# Patient Record
Sex: Male | Born: 1935 | Race: White | Hispanic: No | Smoking: Former smoker
Health system: Southern US, Community
[De-identification: ages and names within clinical notes are randomized; demographics above are authoritative.]

## PROBLEM LIST (undated history)

## (undated) DIAGNOSIS — I499 Cardiac arrhythmia, unspecified: Secondary | ICD-10-CM

## (undated) DIAGNOSIS — N390 Urinary tract infection, site not specified: Secondary | ICD-10-CM

## (undated) DIAGNOSIS — M1990 Unspecified osteoarthritis, unspecified site: Secondary | ICD-10-CM

## (undated) DIAGNOSIS — J189 Pneumonia, unspecified organism: Secondary | ICD-10-CM

## (undated) DIAGNOSIS — C801 Malignant (primary) neoplasm, unspecified: Secondary | ICD-10-CM

## (undated) DIAGNOSIS — K219 Gastro-esophageal reflux disease without esophagitis: Secondary | ICD-10-CM

## (undated) DIAGNOSIS — E119 Type 2 diabetes mellitus without complications: Secondary | ICD-10-CM

## (undated) DIAGNOSIS — I10 Essential (primary) hypertension: Secondary | ICD-10-CM

## (undated) DIAGNOSIS — I2510 Atherosclerotic heart disease of native coronary artery without angina pectoris: Secondary | ICD-10-CM

## (undated) DIAGNOSIS — IMO0001 Reserved for inherently not codable concepts without codable children: Secondary | ICD-10-CM

## (undated) DIAGNOSIS — I219 Acute myocardial infarction, unspecified: Secondary | ICD-10-CM

## (undated) DIAGNOSIS — Z87442 Personal history of urinary calculi: Secondary | ICD-10-CM

## (undated) HISTORY — PX: OTHER SURGICAL HISTORY: SHX169

## (undated) HISTORY — DX: Essential (primary) hypertension: I10

## (undated) HISTORY — PX: HERNIA REPAIR: SHX51

## (undated) HISTORY — PX: EYE SURGERY: SHX253

---

## 1968-01-28 HISTORY — PX: OTHER SURGICAL HISTORY: SHX169

## 1978-01-27 HISTORY — PX: OTHER SURGICAL HISTORY: SHX169

## 1991-01-28 HISTORY — PX: CORONARY ANGIOPLASTY: SHX604

## 1992-01-28 HISTORY — PX: ABDOMINAL ANGIOGRAM: SHX5705

## 1994-01-27 HISTORY — PX: ABDOMINAL AORTIC ANEURYSM REPAIR: SUR1152

## 1997-01-27 HISTORY — PX: VASECTOMY: SHX75

## 1997-01-27 HISTORY — PX: OTHER SURGICAL HISTORY: SHX169

## 2000-01-28 HISTORY — PX: BACK SURGERY: SHX140

## 2000-07-29 ENCOUNTER — Encounter: Payer: Self-pay | Admitting: Orthopedic Surgery

## 2000-07-29 ENCOUNTER — Encounter (INDEPENDENT_AMBULATORY_CARE_PROVIDER_SITE_OTHER): Payer: Self-pay | Admitting: Specialist

## 2000-07-30 ENCOUNTER — Inpatient Hospital Stay (HOSPITAL_COMMUNITY): Admission: RE | Admit: 2000-07-30 | Discharge: 2000-07-31 | Payer: Self-pay | Admitting: Orthopedic Surgery

## 2002-01-27 DIAGNOSIS — I219 Acute myocardial infarction, unspecified: Secondary | ICD-10-CM

## 2002-01-27 HISTORY — DX: Acute myocardial infarction, unspecified: I21.9

## 2002-01-27 HISTORY — PX: CHOLECYSTECTOMY: SHX55

## 2002-01-27 HISTORY — PX: CORONARY ARTERY BYPASS GRAFT: SHX141

## 2002-09-15 ENCOUNTER — Inpatient Hospital Stay (HOSPITAL_COMMUNITY): Admission: EM | Admit: 2002-09-15 | Discharge: 2002-09-23 | Payer: Self-pay | Admitting: Cardiovascular Disease

## 2002-09-15 ENCOUNTER — Encounter: Payer: Self-pay | Admitting: Cardiovascular Disease

## 2002-09-19 ENCOUNTER — Encounter: Payer: Self-pay | Admitting: Cardiothoracic Surgery

## 2002-09-20 ENCOUNTER — Encounter: Payer: Self-pay | Admitting: Cardiothoracic Surgery

## 2002-09-21 ENCOUNTER — Encounter: Payer: Self-pay | Admitting: Cardiothoracic Surgery

## 2002-09-22 ENCOUNTER — Encounter: Payer: Self-pay | Admitting: Cardiothoracic Surgery

## 2004-03-26 ENCOUNTER — Ambulatory Visit: Payer: Self-pay | Admitting: Family Medicine

## 2004-07-29 ENCOUNTER — Ambulatory Visit: Payer: Self-pay | Admitting: Family Medicine

## 2004-11-29 ENCOUNTER — Ambulatory Visit: Payer: Self-pay | Admitting: Family Medicine

## 2005-01-12 ENCOUNTER — Emergency Department (HOSPITAL_COMMUNITY): Admission: EM | Admit: 2005-01-12 | Discharge: 2005-01-12 | Payer: Self-pay | Admitting: Emergency Medicine

## 2005-01-27 HISTORY — PX: OTHER SURGICAL HISTORY: SHX169

## 2006-12-11 ENCOUNTER — Ambulatory Visit (HOSPITAL_COMMUNITY): Admission: RE | Admit: 2006-12-11 | Discharge: 2006-12-13 | Payer: Self-pay | Admitting: Orthopedic Surgery

## 2006-12-11 ENCOUNTER — Encounter (INDEPENDENT_AMBULATORY_CARE_PROVIDER_SITE_OTHER): Payer: Self-pay | Admitting: Orthopedic Surgery

## 2009-01-27 HISTORY — PX: OTHER SURGICAL HISTORY: SHX169

## 2009-06-03 IMAGING — CR DG CHEST 2V
2 series · 2 of 2 positions shown · non-contrast
Comparison: none

CLINICAL DATA: Current HNP.  Previous smoker.  Hypertension.  
 5GAC4-0 VIEWS:
 Sternal wire sutures and mediastinal clips (CABG).  Peribronchial thickening compatible with chronic/remote bronchitic changes.  No active infiltrate.  Minimal nodular density, approximately 4mm in diameter at the right lung base compatible with a granuloma.  Normal size and contours with cardiomediastinal silhouette.  Intact bony thorax.

[w chest pa]
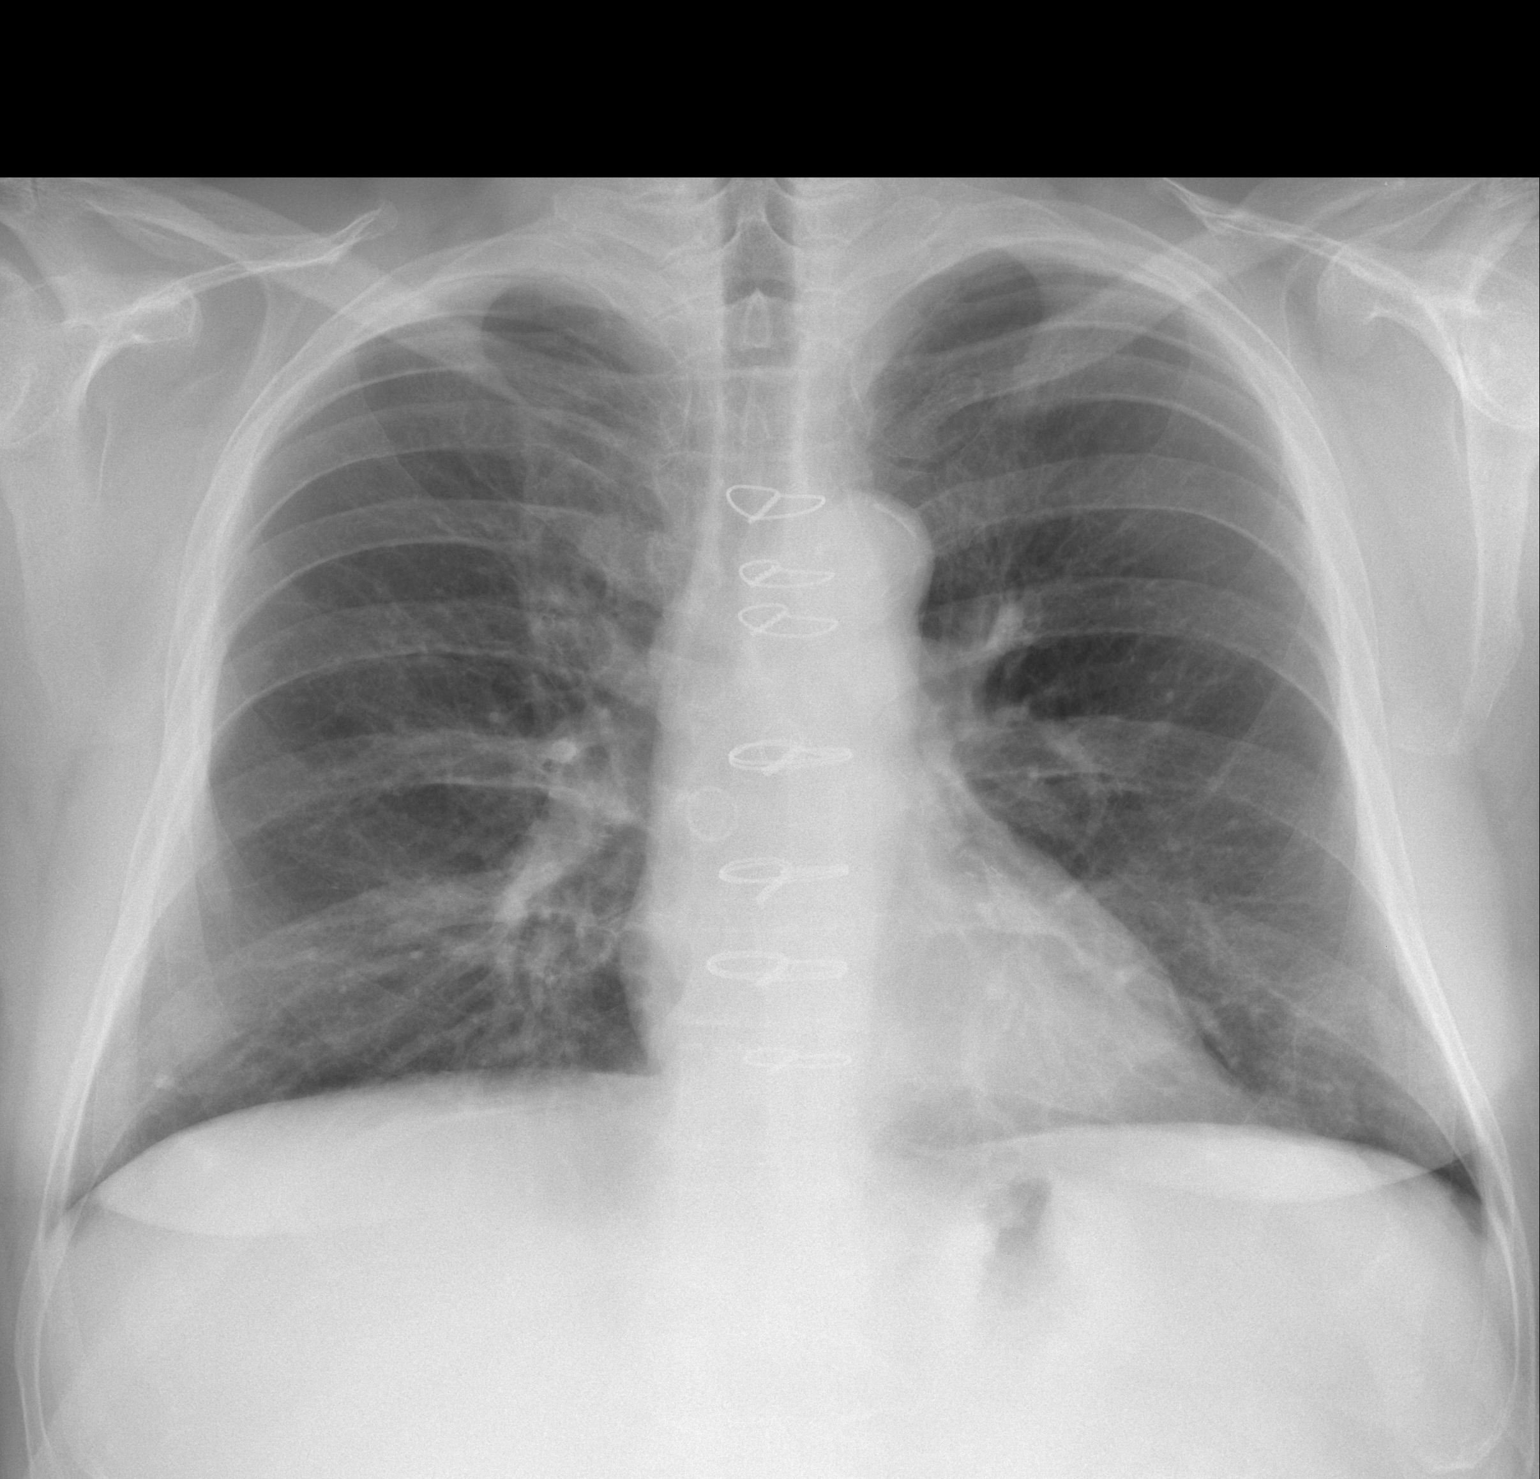

[w chest lat]
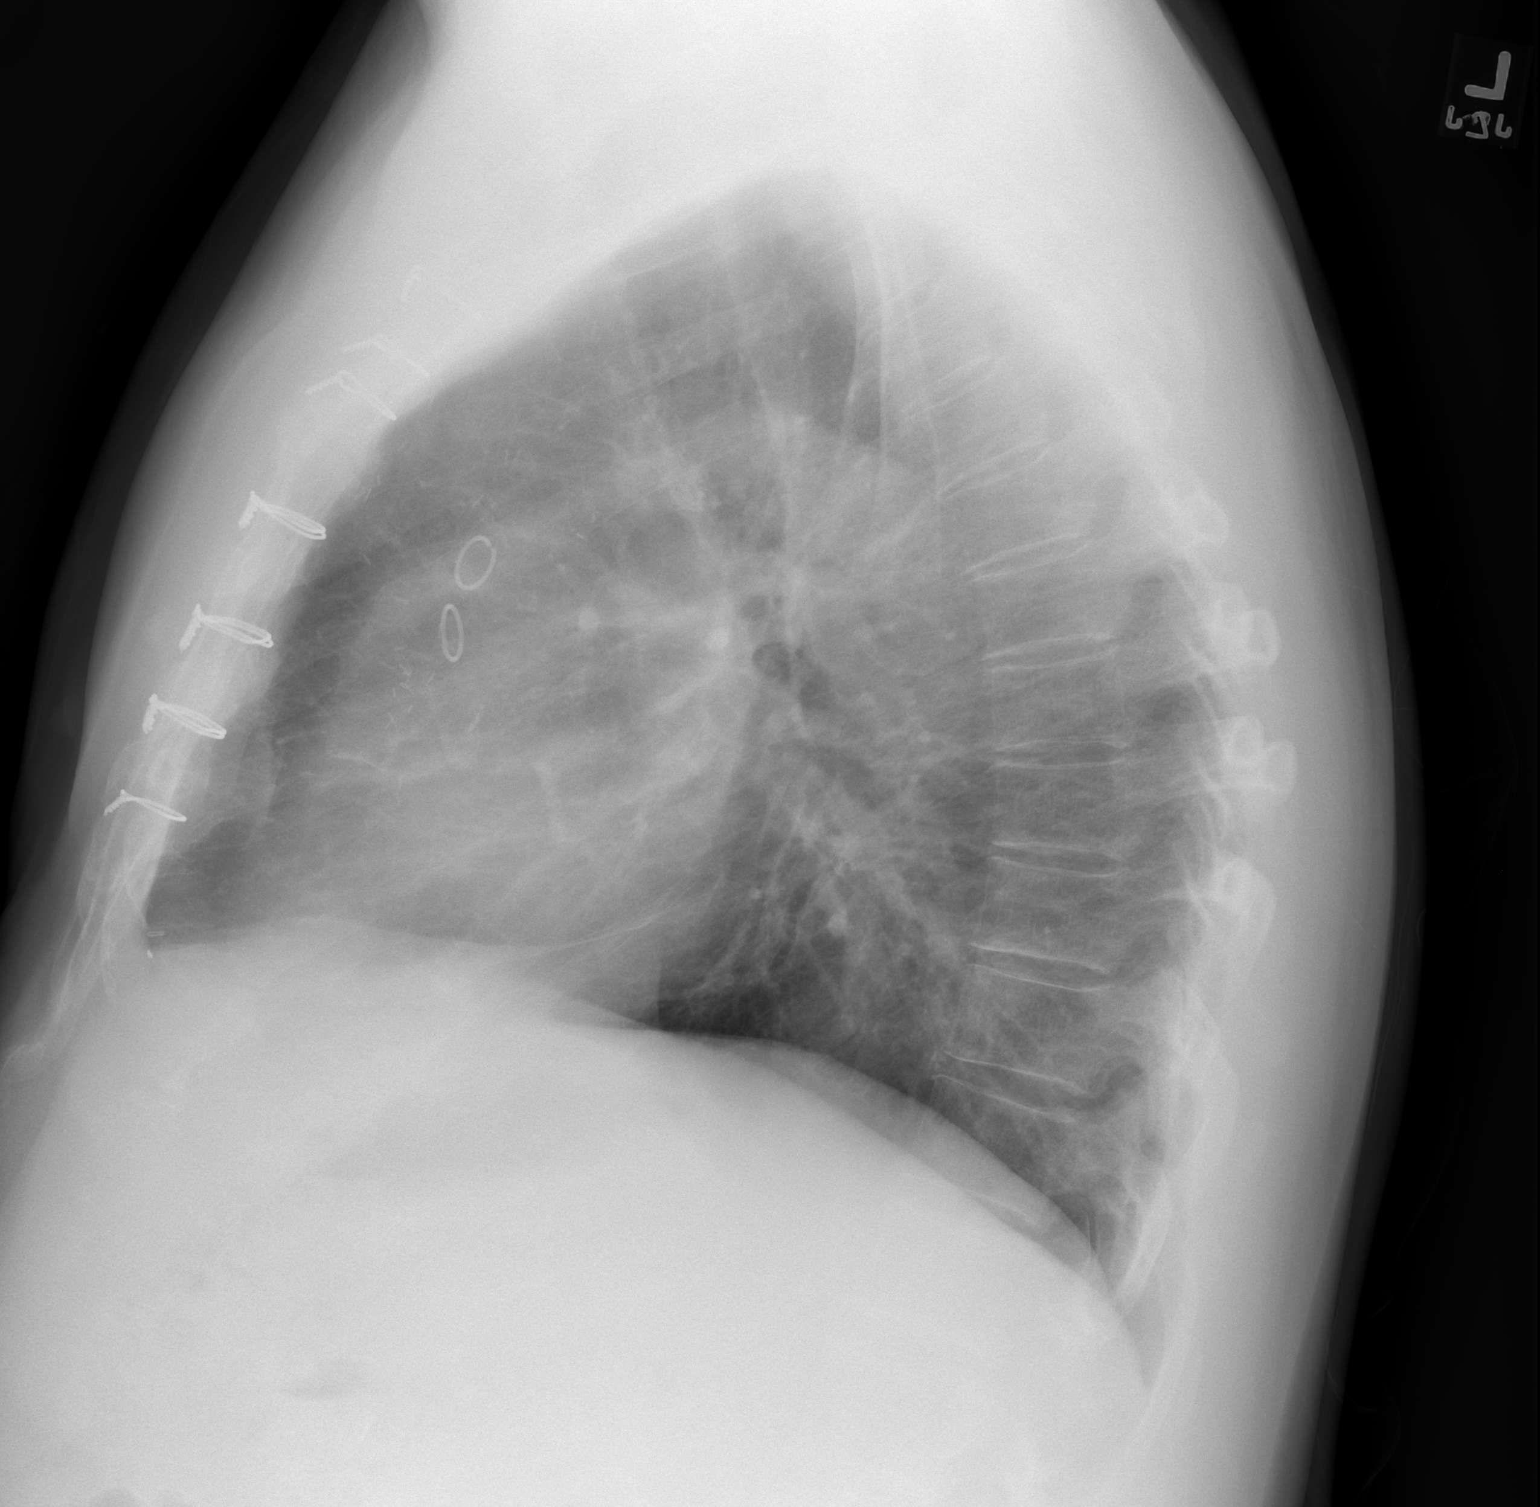

[2 of 2 positions shown; findings below may reference images not displayed]

IMPRESSION: No active chest disease.  Chronic bronchitic markings.

## 2009-06-10 IMAGING — CR DG LUMBAR SPINE 2-3V
2 series · 2 of 2 positions shown · non-contrast
Comparison: none

CLINICAL DATA: Microdiscectomy, L4-5.
 LUMBAR SPINE ? 2 VIEW:

[view not recorded (1 of 2)]
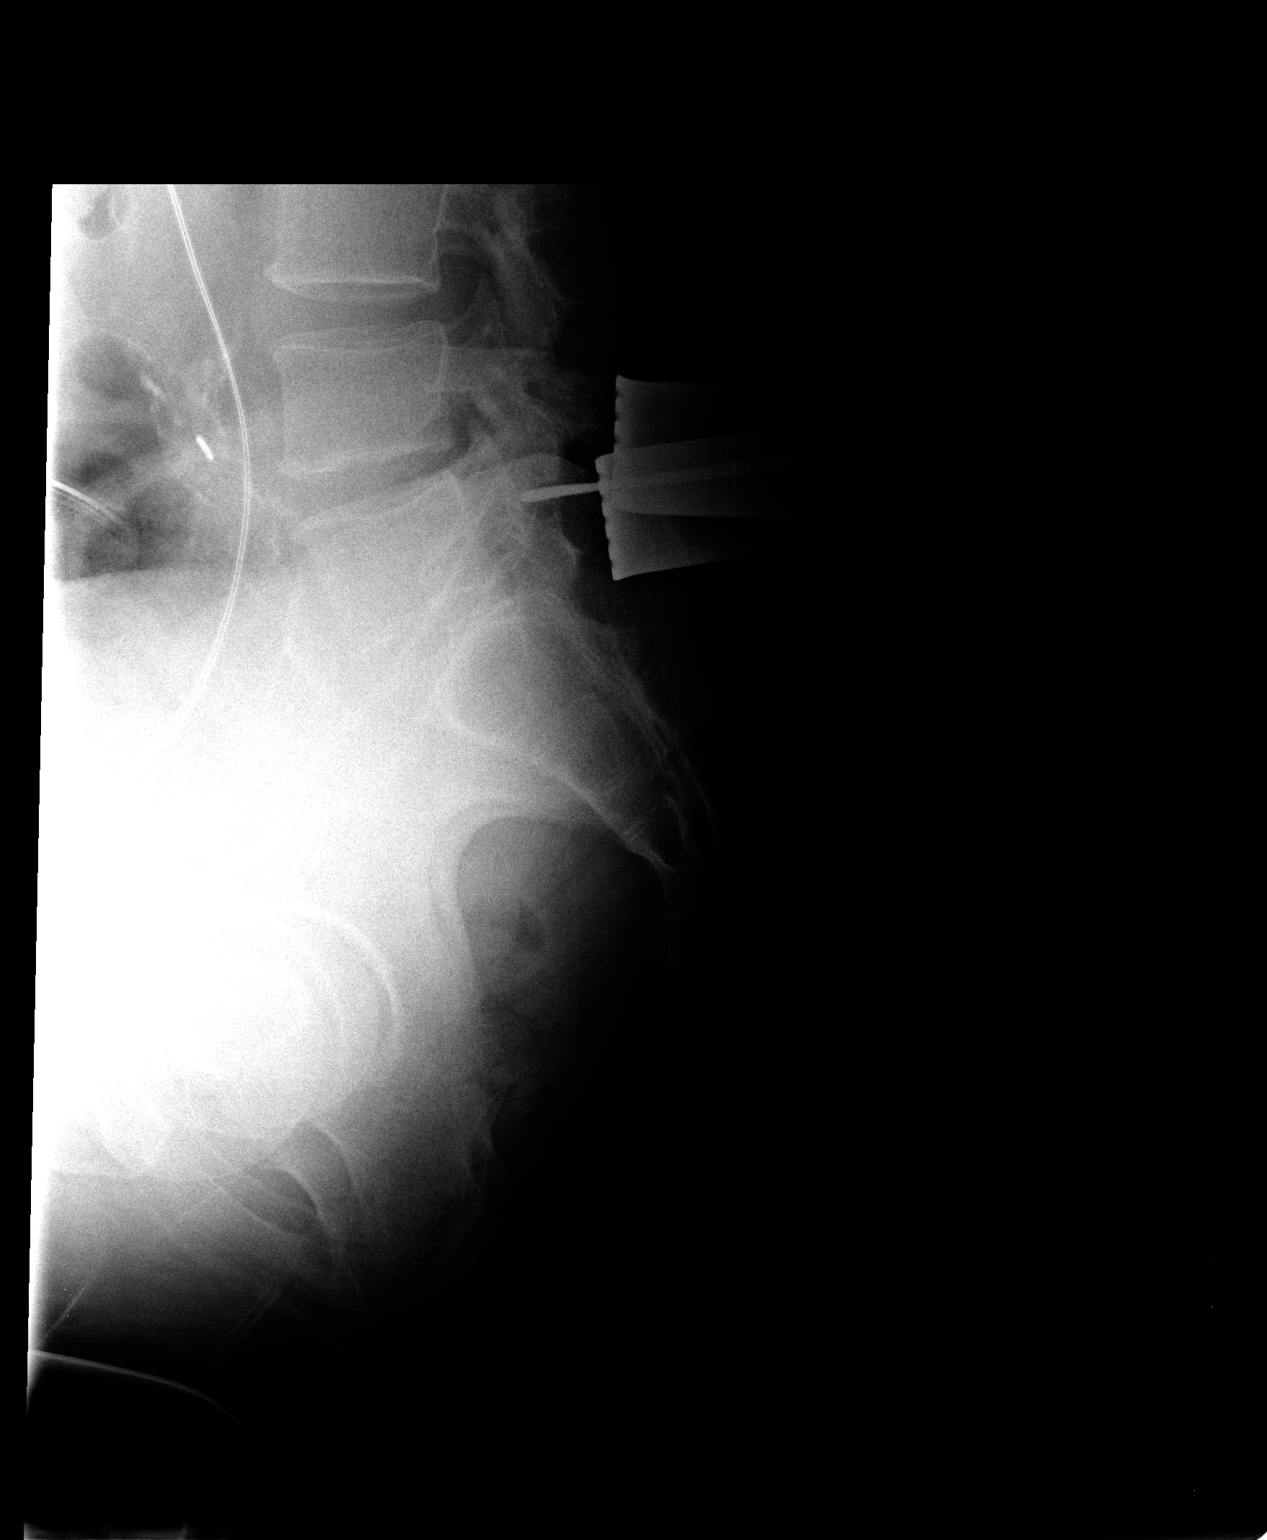

[view not recorded (2 of 2)]
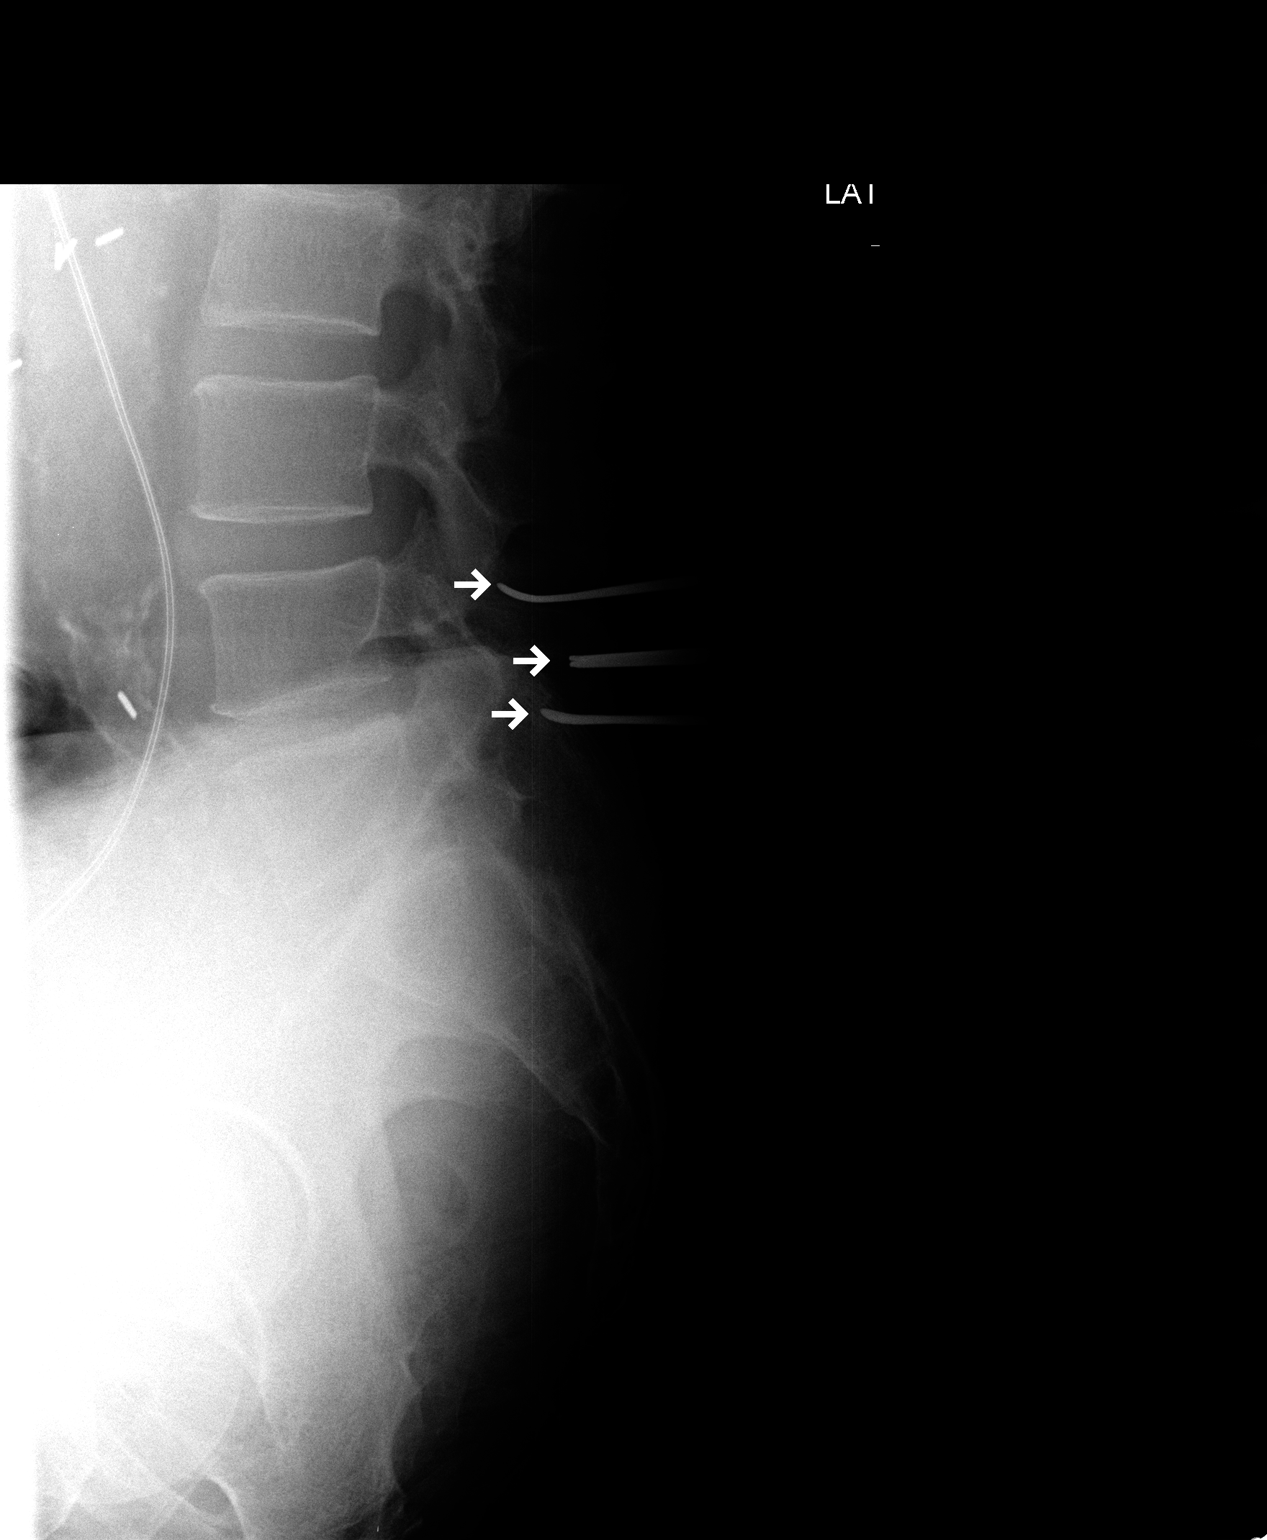

[2 of 2 positions shown; findings below may reference images not displayed]

FINDINGS: Image #1 at 5685 hours reveals surgical instruments overlying the spinous process of L4 and L5, as well as an instrument between the spinous processes of L4 and L5.  
 Image #2 at 5455 hours reveals instrument located below the lamina of L4 directed toward the L5 vertebral body.
IMPRESSION: L4-5 localized.

## 2010-01-27 HISTORY — PX: COLONOSCOPY: SHX174

## 2010-06-11 NOTE — Op Note (Signed)
NAME:  Jeffrey Kelly, Jeffrey Kelly                  ACCOUNT NO.:  [id number]   MEDICAL RECORD NO.:  [id number]          PATIENT TYPE:  OIB   LOCATION:  1612                         FACILITY:  [facility]   PHYSICIAN:  Marlowe Kays, M.D.  DATE OF BIRTH:  [birth year]   DATE OF PROCEDURE:  12/11/2006  DATE OF DISCHARGE:                               OPERATIVE REPORT   PREOPERATIVE DIAGNOSIS:  Recurrent herniated nucleus pulposis, L4-L5  left.   POSTOPERATIVE DIAGNOSIS:  Recurrent herniated nucleus pulposis, L4-L5  left.   OPERATION:  Microdiskectomy of L4-L5, left, with excision of recurrent  herniated nucleus pulposis.   SURGEON:  Marlowe Kays, M.D.   ASSISTANT:  Georges Lynch. Darrelyn Hillock, M.D.   ANESTHESIA:  General.   JUSTIFICATION FOR PROCEDURE:  The original surgery was in 2002 by me.  He had done well until July of this year with return of significant back  and left lower extremity pain leading to an MRI gadolinium on November 24, 2006, which showed a large recurrent disc protrusion with lateral  recess stenosis.  These findings were confirmed at surgery.   PROCEDURE:  Prophylactic antibiotic, satisfactory general anesthesia,  knee chest position on the Luis M. Cintron frame.  The back was prepped with  DuraPrep and draped in a sterile field.  Ioban employed.  Time out  performed.  I went through the old surgical incision and made first two  x-rays after tagging what I thought was the spinous process of L4 and  placing Penfield 4 four instruments above and below. A lateral x-ray  confirmed what we felt was this anatomy and consequently, I proceeded to  expose the L4-L5 interspace on the left.  There was postoperative scar  which further indicated we were at the correct level.  I then made a  second x-ray with a Penfield 4 instrument in this interspace, going  slightly caudad to the disc space confirming our appropriate level. With  the small curette, I undermined the superior portion of L5 and inferior  portion of L4 laminae and working laterally, then used a combination of  2 and 3 mm Kerrison rongeurs to remove bone and fibrous tissue.  He was  extremely tight laterally and we soon brought in the microscope and  completed a lateral recess decompression and also foraminotomy. The dura  and nerve root were quite stuck down to the underlying tissues and we  gently mobilized this with paddy and Penfield 4.  Potential bleeders  were coagulated with bipolar cautery.  The disc space was located,  entered with a 15 knife blade, and we then began removing a large amount  of disc material, some of which was in good size chunks with a  combination of Epstein curette, nerve hook, and straight and angled  upbiting pituitaries.  When we had removed all disc material obtainable  and the L5 nerve root appeared to be very movable, we felt that the  operation had been completed.  The L5 foramen was widely patent.  I  could place a hockey stick in the L4 foramen, as well.  The wound was  irrigated with sterile saline.  There was no unusual bleeding at  closure.  Gelfoam soaked in thrombin was placed over the interspace,  around the dura and nerve root.  The self-retaining retractors were then  removed and  the wound was closed in layers with interrupted #1 Vicryl in the fascia  and deep subcutaneous tissue, 2-0 Vicryl in the superficial subcutaneous  tissue, and staples in the skin.  A Betadine Adaptic dry, sterile  dressing were applied.  He tolerated the procedure well and was taken to  recovery in satisfactory condition with no known complications.           ______________________________  Marlowe Kays, M.D.     JA/MEDQ  D:  12/11/2006  T:  12/12/2006  Job:  [id number]

## 2010-06-14 NOTE — Discharge Summary (Signed)
NAME:  Jeffrey Kelly, Jeffrey Kelly                            ACCOUNT NO.:  [id number]   MEDICAL RECORD NO.:  [id number]                   PATIENT TYPE:  INP   LOCATION:  2036                                 FACILITY:  [facility]   PHYSICIAN:  Gwenith Daily. Tyrone Sage, M.D.            DATE OF BIRTH:  March 23, 1934   DATE OF ADMISSION:  09/15/2002  DATE OF DISCHARGE:  09/23/2002                                 DISCHARGE SUMMARY   HISTORY OF PRESENT ILLNESS:  This is a 76 year old male with a prior history  of coronary artery disease who was in his usual state of health until he  developed left arm pain that spread to his left shoulder the morning prior  to admission.  He was out shopping with his wife and upon returning home he  developed arm pain.  He tried to rest but this did not help.  He  subsequently went to [facility] Emergency Room where he was given  nitroglycerin with relief of pain and patient was felt to require transfer  to [facility] for further cardiac evaluation and treatment.   PAST MEDICAL HISTORY:  1. Coronary artery disease.  2. Status post angioplasty in 1993.  3. Peripheral vascular disease.  4. History of abdominal aortic aneurysm repair 1996.  5. History of previous back surgery L4-5 in 2002.  6. Cholecystectomy 2004.  7. Adult-onset diabetes.  8. Status post knee surgery.  9. Hypertension.  10.      Hyperlipidemia.   FAMILY HISTORY:  Please see the history and physical done at the time of  admission.   SOCIAL HISTORY:  Please see the history and physical done at the time of  admission.   REVIEW OF SYSTEMS:  Please see the history and physical done at the time of  admission.   PHYSICAL EXAMINATION:  Please see the history and physical done at the time  of admission.   MEDICATIONS ON ADMISSION:  1. Lopressor 25 mg b.i.d.  2. Zocor 80 mg q.h.s.  3. Lisinopril 5 mg daily.  4. Aspirin 325 mg daily.  5. NitroQuick 0.4 mg p.r.n.  6. Glucophage 850 mg b.i.d.  7. Glipizide 10 mg daily.   ALLERGIES:  He has an intolerance to MORPHINE.   HOSPITAL COURSE:  The patient was admitted with presumed unstable angina.  He was felt to require cardiac catheterization on an urgent basis.  This was  done by Cristy Hilts. Jacinto Halim, M.D. on the day of admission.  Findings were  remarkable for multivessel coronary artery disease.  There was a critical  proximal ostial LAD lesion that was not felt to be suitable for angioplasty.  Additionally, there was a 40% diagonal lesion, an 80% distal obtuse marginal  #1 lesion, a 50% right coronary artery lesion.  The previous PDA stent site  was widely patent.  Due to these findings cardiac surgical consultation was  obtained with Ramon Dredge  Bari Edward, M.D. who evaluated the patient and his  studies and recommended surgical revascularization.  The patient was  stabilized from a medical point of view and on September 19, 2002 the patient  was taken to the operating room where he underwent the following procedure:  Coronary artery bypass grafting x3.  The following grafts were placed:  Left  internal mammary artery to the LAD, saphenous vein graft to obtuse marginal  1, saphenous vein graft to the posterior descending.  Cross clamp time was  48 minutes.  The patient tolerated procedure well.  Was taken to the  surgical intensive care unit in stable condition.  Postoperative hospital  course patient did quite well.  He was extubated several hours after surgery  using routine extubation protocols.  The patient had no significant  postoperative bleeding.  His intensive care unit stay was essentially  unremarkable.  All routine lines, monitors, drainage devices were  discontinued in a standard manner.  He was weaned from his inotropes.  On  postoperative day #2 he was felt to be stable for transfer to the 2000  telemetry unit.  His laboratory values showed just a mild anemia.  Most  recent hemoglobin and hematocrit dated September 22, 2002 are  11.4 and 33.6,  respectively.  Electrolytes, BUN, and creatinine are stable.  Glucose  management has been adequate.  He is a diabetic and has been started back on  his oral regimen.  His capillary blood glucoses have shown adequate  postoperative control.  He has responded well in regards to rehabilitation  following routine cardiac rehabilitation phase I modalities.  His incisions  are healing well without signs of infection.  Oxygen has been weaned and he  maintains good saturations on room air.  He has had no significant cardiac  dysrhythmias.  Overall, he is felt to be quite stable for tentative  discharge in the morning of September 23, 2002 pending morning round  reevaluation.   DISCHARGE MEDICATIONS:  1. For pain Tylox one to two q.4-6h. p.r.n.  2. Aspirin 325 mg daily.  3. Lopressor 25 mg b.i.d. as before.  4. Zocor 80 mg q.h.s. as before.  5. Glucotrol 10 mg daily as before.  6. Lisinopril 5 mg daily as before.  7. Glucophage 850 mg daily as before.   DISCHARGE INSTRUCTIONS:  The patient will receive written instructions in  regard to medications, activity, diet, wound care, and follow-up.  Follow-up  will include Cristy Hilts. Jacinto Halim, M.D. two weeks, Gwenith Daily. Tyrone Sage, M.D. three  weeks.   CONDITION ON DISCHARGE:  Stable and improved.   FINAL DIAGNOSES:  1. Severe multivessel coronary artery disease as described above.  2. Hypertension.  3. Adult-onset diabetes mellitus.  4.     Hyperlipidemia.  5. Previous surgeries including knee surgery, cholecystectomy, back surgery     L4-5, abdominal aortic aneurysm resection and grafting, as well as     previous coronary angioplasty.      Rowe Clack, P.A.-C.                    Gwenith Daily Tyrone Sage, M.D.    Sherryll Burger  D:  09/22/2002  T:  09/23/2002  Job:  [id number]   cc:   Gerlene Burdock A. Alanda Amass, M.D.  [phone] [address], [address]  [place]  [place] [address]  Fax: [phone]   Dina Rich  [address]  [place] [address]  Fax:  [phone]

## 2010-06-14 NOTE — H&P (Signed)
[facility]  Patient:    Jeffrey Kelly, Jeffrey Kelly                           MRN: [id number] Adm. Date:  07/29/00 Attending:  Fayrene Fearing P. Aplington, M.D. Dictator:   Druscilla Brownie. Shela Nevin, P.A. CC:         Richard A. Alanda Amass, M.D.  Dina Rich, M.D.; [place], [place] [address]   History and Physical  DATE OF BIRTH:  1934/11/13  CHIEF COMPLAINT:  Pain in my back, hip, and left leg.  HISTORY OF PRESENT ILLNESS:  A 76 year old white male seen by [place] for continuing and progressive problems concerning pain into his left lower extremity.  The patient was seen in our practice at the request of Dr. Sol Passer and seen by Dr. Ethelene Hal.  The patient had steroid injections into the low back. This gave him some temporary relief but unfortunately the pain continued.  He was referred to Dr. Simonne Come by Dr. Ethelene Hal.  The patient has been on anti-inflammatories, exercises, rest in the past as well as the epidural steroid injections have not helped him.  Radiologic examination has shown that the patient has discopathy with L4-L5 herniated nucleus pulposus on the left. After much discussion and exhausting conservative care, it is felt that the patient would benefit from surgical intervention and is being admitted for microdiskectomy L4-5 on the left.  The patient has been cleared preoperatively by Dr. Alanda Amass and Dr. Jenne Campus.  PAST MEDICAL HISTORY:  The patient has coronary artery disease and has undergone heart catheterization.  He also has had aortic aneurysm with a graft replacement.  He had a vasectomy in 1969, surgery to the right knee in 1970 for torn cartilage.  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  A cyst was removed from his scrotum in 1999.  Medically, he has hyperlipidemia, coronary artery disease, type 2 diabetes.  CURRENT MEDICATIONS: 1. Lipitor 40 mg one q.d. 2. Metoprolol 50 mg one-half tablet b.i.d. 3. Altace 5 mg one q.d. 4.  Ecotrin 325 mg one q.d. per ______ will stop five days prior to surgery. 5. Nitroglycerin lingual spray p.r.n. 6. Actos 45 mg one q.d. 7. Starlix 120 mg one before meals t.i.d. 8. Vitamin E 1000 international units q.d.  SOCIAL HISTORY:  Family physician is Dr. Dina Rich in [place], [place].  His cardiologist is Dr. Susa Griffins.  The patient is married. Neither smokes nor drinks.  FAMILY HISTORY:  Noncontributory.  REVIEW OF SYSTEMS:  CNS:  No seizures, shoulder paralysis, numbness, or double vision.  Patient does have radiculopathy into the ______ lower extremity consistent with L4-5 nerve root compression on the left.  CARDIOVASCULAR:  No chest pain, angina, orthopnea.  RESPIRATORY:  No productive cough, hemoptysis, shortness of breath.  GASTROINTESTINAL:  No nausea, vomiting, melena, or bloody stools.  GENITOURINARY:  No discharge, dysuria, hematuria. MUSCULOSKELETAL:  Primarily in present illness with pain into the left lower extremity.  PHYSICAL EXAMINATION  GENERAL:  Alert, cooperative, friendly 76 year old white male who is accompanied by his wife.  VITAL SIGNS:  Blood pressure 120/72 seated, pulse 64 and regular, respirations 12 and unlabored.  HEENT:  Normocephalic.  Wears glasses.  PERRLA.  EOM intact.  Oropharynx is clear.  CHEST:  Clear to auscultation.  No rhonchi, rales, wheezes.  HEART:  Regular rate and rhythm.  No murmurs are heard.  ABDOMEN:  Obese, soft, nontender.  Liver, spleen not felt.  Bowel sounds present.  GENITALIA:  Not done.  Not pertinent to present illness.  RECTAL:  Not done.  Not pertinent to present illness.  EXTREMITIES:  Patient has no gross weakness in the right lower extremity. Straight leg raise is positive at about 60 degrees.  DTRs are equal.  ADMITTING DIAGNOSES: 1. Herniated nucleus pulposus L4-5 on the left. 2. Coronary artery disease. 3. Hyperlipidemia. 4. Hypertension. 5. Diabetes.  PLAN:  The patient  will be admitted for microdiskectomy L4-L5 on the left.  He will probably have an overnight stay.  If he should have any medical problems we will certainly ask Dr. Susa Griffins to follow along with [place] during this patients hospitalization. DD:  07/22/00 TD:  07/22/00 Job: [id number]

## 2010-06-14 NOTE — Op Note (Signed)
[facility]  Patient:    Jeffrey Kelly, Jeffrey Kelly                           MRN: [id number] Proc. Date: 07/29/00 Attending:  Fayrene Fearing P. Aplington, M.D.                           Operative Report  PREOPERATIVE DIAGNOSIS:  Herniated nucleus pulposus L4-5 left.  POSTOPERATIVE DIAGNOSIS:  Herniated nucleus pulposus L4-5 left.  OPERATION:  Microdiskectomy L4-5 left with excision of herniated nucleus pulposus and decompression of L5 nerve root.  SURGEON:  Illene Labrador. Aplington, M.D.  ASSISTANT:  Philips J. Montez Morita, M.D.  ANESTHESIA:  General.  PATHOLOGY AND JUSTIFICATION FOR PROCEDURE:  Large central to the left disk herniation compressing the L5 nerve root which has failed to respond to nonsurgical treatment including steroid injections.  DESCRIPTION OF PROCEDURE:  Antibiotic prophylaxis.  Satisfactory general anesthesia, knee-chest position on the [place] frame.  Back was prepped with DuraPrep, and with three spinal needles and lateral x-ray, I tentatively localized the L4-5 interspace.  Then completed draping the back into a sterile field.  We made a midline incision and carried it down to the spinous processes which we thought were L4 and L5.  I tagged these with Kocher clamps and took a second lateral x-ray, and this confirmed that these were on the spinous processes of L4 and L5 with the L4-5 disk midway between.  I then began dissecting soft tissue off the lamina of L4 and L5 and placed a self-retaining McCullough retractor.  With a small curette, I undermined the superior portion of L5 and with 2 mm Kerrison rongeur, was able to get beneath the lamina and began removing bone and ligamentum flavum.  We then used a combination of double-action rongeur, 2 and 3 mm Kerrison rongeurs to do the preliminary decompression of the dura and the L5 nerve root.  He had a good bit of lateral bony recessed stenosis and foraminal stenosis.  When dissection became a little more  difficult, I brought in the microscope and completed the decompression, particularly the foraminal decompression of the L5 nerve root. We then retracted the nerve root and dura.  He was very vascular over the L4-5 disk which was noted to be herniated, as described on the MRI.  We first cauterized the potential bleeders with bipolar cautery and  then opened the interspace with the 15 knife blade, and we then removed a large amount of disk material, some of which was in chunks, and a good bit of it was lying underneath the posterior longitudinal ligament, extending slightly over the body of L5.  We used a combination of straight and angled upright pituitaries, Epstein curette, and nerve hook.  When we had removed all disk material that we could find and that was retrievable, we then checked with the hockey stick to be sure that the L4-5 foramen was widely patent.  There did not appear to be any protuberances underneath the dura at L4-5, and the L3-4 foramen was also patent.  The wound was irrigated with sterile saline.  There was no unusual bleeding.  Gelfoam was placed over the interspace and the bed of the wound and also over the L5 nerve root and dura. The self-retaining retractors were removed.  There was no unusual bleeding. I then gave him 30 mg of Toradol IV, closed the fascia with interrupted #1 Vicryl, and  the deep subcutaneous tissue deep with the same.  I infiltrated the soft tissues subcutaneously with 0.5% plain Marcaine and completed the closure with 2-0 Vicryl superficial subcutaneous tissue and staples in the  skin.  Betadine and Adaptic dry sterile dressing were applied.  He tolerated the procedure well and at the time of this dictation was on his way to the recovery room in satisfactory condition with no known complications. DD:  07/29/00 TD:  07/29/00 Job: [id number]

## 2010-06-14 NOTE — Discharge Summary (Signed)
[facility]  Patient:    Jeffrey Kelly, Jeffrey Kelly                         MRN: [id number] Adm. Date:  [id number] Disc. Date: 07/31/00 Attending:  Marlowe Kays Page Dictator:   Irena Cords, P.A.-C. CC:         Richard A. Alanda Amass, M.D.  Sheliah Hatch, M.D. in [facility]   Discharge Summary  PRINCIPAL DIAGNOSIS:  Herniated nucleus pulposus at L4-5 on the left.  SECONDARY DIAGNOSES: 1. Coronary artery disease. 2. Hyperlipidemia. 3. Hypertension. 4. Diabetes.  SURGICAL PROCEDURE:  Microdiskectomy of L4-5 left with excision of herniated nucleus pulposus and decompression of L5 nerve root by Dr. Simonne Come with the assistance of Dr. Montez Morita on 07/29/00.  Please see operative summary for further details.  CONSULTATIONS:  None.  LABORATORY DATA:  The patients preoperative urinalysis was all within normal limits with negative leukocyte esterase and nitrites.  Laboratory work from July 07, 2000:  Basic metabolic panel just showed a mildly elevated glucose at 126, creatinine was 1.2, potassium was 5.3, and sodium was 137.  CBC:  White count was 7.9, hemoglobin and hematocrit of 14.5 and 42.8, respectively.  His lipid profile was also within normal limits.  Preoperative chest x-ray of October 2001, was read as normal chest x-ray.  EKG from July 06, 2000, was read as no acute findings.  The patient also had a Persantine Cardiolite perfusion study on July 07, 2000, inferior wall scar at the base to the apex which represented a low risk study.  His ejection fraction was 55%.  CHIEF COMPLAINT:  Pain in my back, hip, and left leg.  HISTORY OF PRESENT ILLNESS:  Mr. Jeffrey Kelly is a 76 year old male who has had worsening of progressive pain in his left lower extremity and back.  He had undergone some steroid injections of the back with just temporary relief, but no long-term pain control.  He has been on various anti-inflammatory medications without improvement.  He was  initially seen by Dr. Ethelene Hal, a podiatrist in our office, and then referred over to Dr. Simonne Come for further evaluation and treatment.  Despite therapy, the anti-inflammatory medications, the injections, he did not see long-term improvement.  Radiographic studies demonstrated an L4-5 herniated disk on the left.  It was recommended he may benefit from surgical intervention after his lack of benefit with conservative measures.  He underwent preoperative clearance by Dr. Alanda Amass and Dr. Jenne Campus, and was found to be clinically stable from a cardiac standpoint to undergo the surgery.  He was admitted on July 29, 2000, for surgical intervention.  HOSPITAL COURSE:  Following the surgical procedure, the patient was taken to the PACU in stable condition, transferred to the orthopedic floor in good condition.  He did well during his hospital stay.  On postoperative day #1, he did require some additional utilization of the PCA morphine.  Because of pain control, he was kept for an additional postoperative day.  He was then weaned off the PCA, and switched over to p.o. Percocet which he tolerated well and controlled his pain well.  On postoperative day #2, physical therapy saw the patient to assist with some further ambulation and some stair training which he did well with, and manipulated the stairs well.  His incision was examined on postoperative day #2, and found to be clean, dry, and intact without signs or symptoms of infection.  He received three doses of Ancef 1 g IV  postoperatively.  By postoperative day #2, he was stable and ready for discharge home.  DISPOSITION:  The patient will be discharged home.  FOLLOWUP:  He is to follow up with Dr. Simonne Come two weeks out from surgery. He is to call [phone] for an appointment.  DIET:  He is to resume an ADA 1800 calorie low sodium diet.  ACTIVITY:  Do no bending, stooping, squatting, or heavy lifting.  WOUND CARE:  Once daily dressing  changes.  May begin showering on postoperative day #5.  DISCHARGE MEDICATIONS:  1. Percocet 5/325 mg one or two p.o. q.4-6h. p.r.n. pain.  2. Robaxin 500 mg one p.o. q.8h. p.r.n. spasm.  3. Lipitor 40 mg p.o. q.d.  4. Metoprolol 50 mg 1/2 tablet b.i.d.  5. Altace 5 mg p.o. q.d.  6. Ecotrin 325 mg p.o. q.d.  7. Nitroglycerin spray p.r.n.  8. Actos 45 mg p.o. q.d.  9. Starlix 120 mg one before meals t.i.d. 10. Vitamin E 1000 international units p.o. q.d.  CONDITION ON DISCHARGE:  Good and improved.  FINAL DIAGNOSIS:  Status post microdiskectomy L4-5 left with excision of herniated nucleus pulposus and decompression of L5 nerve root. DD:  07/31/00 TD:  07/31/00 Job: [id number]

## 2010-06-14 NOTE — Cardiovascular Report (Signed)
   NAME:  Jeffrey Kelly, Jeffrey Kelly                            ACCOUNT NO.:  [id number]   MEDICAL RECORD NO.:  [id number]                   PATIENT TYPE:  INP   LOCATION:  2903                                 FACILITY:  [facility]   PHYSICIAN:  Darlin Priestly, M.D.             DATE OF BIRTH:  1934-08-08   DATE OF PROCEDURE:  09/16/2002  DATE OF DISCHARGE:                              CARDIAC CATHETERIZATION   PROCEDURE:  Coronary angiography.   COMPLICATIONS:  None.   INDICATIONS:  [name] is a 76 year old male patient of Dr. Sol Passer with a  history of diabetes, hypertension, hyperlipidemia, history of PVD status  post abdominal aneurysm repair with PTCA of the RCA in 1993.  The patient  was readmitted on July 16, 2002 with a complaint of right chest pain and  shortness of breath.  He subsequently underwent cardiac catheterization by  Cristy Hilts. Jacinto Halim, M.D. on September 15, 2002 revealing 70-80% ostial LAD lesion.  However, it was unclear whether this was a separate ostium or came off a  short left main.  He is now brought back for repeat look at his LAD for  consideration of percutaneous intervention.   DESCRIPTION OF PROCEDURE:  After giving written informed consent the patient  brought to the cardiac catheterization laboratory.  Right and left groin was  shaved, prepped, and draped in usual sterile fashion.  ECG line established.  Using modified Seldinger technique a #6-French arterial sheath inserted in  right femoral artery.  Next, a #6-French JL4 guiding catheter was engaged in  the left circumflex selectively and angiogram was performed.  This revealed  irregular circumflex.  However, there did appear to be a separate ostium to  the circumflex but this was very close to the ostium for the LAD.  The LAD  was then selectively engaged and there was pressure damping noted.  There  appeared to be a 70-80% lesion in the ostial LAD with no significant disease  beyond the ostium.  The first and second  diagonals were medium sized vessels  with no significant disease.  Several flush outs of the root revealed that  the left main and circumflex appeared to originate through a separate  ostium, but did appear to be close in their origin.   HEMODYNAMICS:  Systemic arterial pressure 104/65.   CONCLUSION:  Significant 80% ostial left anterior descending disease.                                               Darlin Priestly, M.D.    RHM/MEDQ  D:  09/16/2002  T:  09/16/2002  Job:  [id number]   cc:   Dina Rich  [address]  [place]  [place] [address]  Fax: [phone]

## 2010-06-14 NOTE — Op Note (Signed)
NAME:  Jeffrey Kelly, Jeffrey Kelly                            ACCOUNT NO.:  [id number]   MEDICAL RECORD NO.:  [id number]                   PATIENT TYPE:  INP   LOCATION:  2313                                 FACILITY:  [facility]   PHYSICIAN:  Gwenith Daily. Tyrone Sage, M.D.            DATE OF BIRTH:  1934-03-20   DATE OF PROCEDURE:  09/19/2002  DATE OF DISCHARGE:                                 OPERATIVE REPORT   PREOPERATIVE DIAGNOSES:  Coronary occlusive disease with unstable angina.   POSTOPERATIVE DIAGNOSES:  Coronary occlusive disease with unstable angina.   OPERATION PERFORMED:  Coronary artery bypass grafting times three with the  left internal mammary artery to the left anterior descending coronary  artery, reversed saphenous vein graft to the first obtuse marginal, reversed  saphenous vein graft to the distal right coronary artery with left Endo vein  harvesting.   SURGEON:  Gwenith Daily. Tyrone Sage, M.D.   ASSISTANT:  Toribio Harbour, N.P.   ANESTHESIA:  General.   INDICATIONS FOR PROCEDURE:  The patient is a 76 year old male with diabetes  who presents with unstable anginal symptoms primarily arm pain at rest and  minimal exertion.  He underwent cardiac catheterization by Cristy Hilts. Jacinto Halim,  M.D. and then the following day by Darlin Priestly, M.D.  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  In addition, he had 50% proximal circumflex narrowing and at the  takeoff of the first obtuse marginal, 60 to 70% stenosis.  A second smaller  obtuse marginal had irregular disease but was small.  In addition, the  proximal third of the vessel had diffuse luminal irregularities with areas  of narrowing of at least 50 to 60%.  Because of the patient's symptoms and  primarily because of his critical proximal LAD lesion that was not amenable  to angioplasty, coronary artery bypass grafting  was recommended to the  patient, who agreed and signed informed consent.   DESCRIPTION OF PROCEDURE:  With Swann-Ganz and arterial line monitors in  place, the patient underwent general endotracheal anesthesia without  incident.  The skin of the chest and legs was prepped with Betadine and  draped in the usual sterile manner.  Using the [employer] End[employer] vein harvesting  system, vein was harvested from the right thigh and was of good quality and  caliber.  A median sternotomy was performed.  The left internal mammary  artery was dissected down as a pedicle graft.  The distal artery was divided  and had good free flow.  The pericardium was opened.  Overall ventricular  function appeared preserved.  The patient was systemically heparinized.  The  ascending aorta and the right atrium were cannulated in the aortic root.  A  bent cardioplegia needle was introduced into the ascending aorta.  The  patient was placed on cardiopulmonary bypass at 2.4L per minute per meter  squared.  Sites for anastomosis were selected and dissected out of the  epicardium.  The largest vessel on the lateral wall was selected and  identified as the larger first obtuse marginal.  The more distal branch of  the artery as it arose from the AV groove was too small for bypassing.  The  patient's body temperature was cooled to 32 degrees.  An aortic crossclamp  was applied.  of cold blood potassium cardioplegia was administered  with rapid diastolic arrest of the heart.  Myocardial septal temperature was  monitored throughout the crossclamp period.   Attention was turned first to the obtuse marginal vessel which was opened.  It admitted a 1.5 mm probe.  Using a running 7-0 Prolene, the distal  anastomosis was performed.  Attention was then turned to the very distal  right coronary artery which diffusely diseased and thickened.  It appeared a  slightly more suitable vessel onto the posterior descending.  The vessel was   opened at this point and opened back up into the very distal right coronary  artery.  Using a running 7-0 Prolene, distal anastomosis was performed with  a segment of reversed saphenous vein graft to the distal right onto the  posterior descending coronary artery.  Attention was then turned to the left  anterior descending coronary artery which was opened.  Using running 8-0  Prolene left internal mammary artery was anastomosed to the left anterior  descending coronary artery.  With release of the Edwards bulldog on the  mammary artery, there was appropriate rise in myocardial septal temperature.  Aortic cross-clamp was removed.  Total crossclamp time was 48 minutes.  A  partial occlusion clamp was placed on the ascending aorta.  Two punch  aortotomies were performed and each of the two vein grafts were anastomosed  to the ascending aorta.  Air was evacuated from the grafts and the partial  occlusion clamp was removed.  The sites of anastomosis were inspected and  were free of bleeding.  The patient was then ventilated and weaned from  cardiopulmonary bypass without difficulty.  He remained hemodynamically  stable, was decannulated in the usual fashion.  Protamine sulfate was  administered.  With the operative field hemostatic, two atrial and two  ventricular pacing wires were applied.  Graft markers were applied.  A left  pleural tube and two mediastinal tubes were left in place.  Sternum was  closed with #6 stainless steel wire.  Fascia closed with interrupted 0  Vicryl, running 3-0 Vicryl in the subcutaneous tissues and 4-0 subcuticular  stitch in the skin edges.  Dry dressings were applied.  Sponge and needle  counts were reported as correct at the completion of the procedure.  The  patient tolerated the procedure without obvious complication and was  transferred to the surgical intensive care unit for further postoperative  care.                                                 Gwenith Daily Tyrone Sage, M.D.    Tyson Babinski  D:  09/20/2002  T:  09/20/2002  Job:  [id number]   cc:   Cristy Hilts. Jacinto Halim, M.D.  [address]  [place]  [place] [address]  Fax: [phone]

## 2010-11-05 LAB — BASIC METABOLIC PANEL
BUN: 18
Chloride: 106
GFR calc Af Amer: >60
GFR calc non Af Amer: 58 — ABNORMAL LOW
Glucose, Bld: 141 — ABNORMAL HIGH
Potassium: 4.5
Sodium: 142

## 2010-11-05 LAB — HEMOGLOBIN AND HEMATOCRIT, BLOOD: Hemoglobin: 15.8

## 2010-11-05 LAB — URINALYSIS, ROUTINE W REFLEX MICROSCOPIC
Bilirubin Urine: NEGATIVE
Specific Gravity, Urine: 1.02
pH: 5.5

## 2011-05-05 DIAGNOSIS — M436 Torticollis: Secondary | ICD-10-CM | POA: Diagnosis not present

## 2011-05-29 DIAGNOSIS — N433 Hydrocele, unspecified: Secondary | ICD-10-CM | POA: Diagnosis not present

## 2012-06-12 DIAGNOSIS — H81399 Other peripheral vertigo, unspecified ear: Secondary | ICD-10-CM | POA: Diagnosis not present

## 2012-07-20 DIAGNOSIS — S91309A Unspecified open wound, unspecified foot, initial encounter: Secondary | ICD-10-CM | POA: Diagnosis not present

## 2012-11-01 DIAGNOSIS — L301 Dyshidrosis [pompholyx]: Secondary | ICD-10-CM | POA: Diagnosis not present

## 2012-11-01 DIAGNOSIS — L981 Factitial dermatitis: Secondary | ICD-10-CM | POA: Diagnosis not present

## 2012-11-22 DIAGNOSIS — L981 Factitial dermatitis: Secondary | ICD-10-CM | POA: Diagnosis not present

## 2012-11-22 DIAGNOSIS — L301 Dyshidrosis [pompholyx]: Secondary | ICD-10-CM | POA: Diagnosis not present

## 2013-01-18 DIAGNOSIS — J189 Pneumonia, unspecified organism: Secondary | ICD-10-CM | POA: Diagnosis not present

## 2013-01-30 DIAGNOSIS — S43499A Other sprain of unspecified shoulder joint, initial encounter: Secondary | ICD-10-CM | POA: Diagnosis not present

## 2013-01-30 DIAGNOSIS — S46819A Strain of other muscles, fascia and tendons at shoulder and upper arm level, unspecified arm, initial encounter: Secondary | ICD-10-CM | POA: Diagnosis not present

## 2013-02-28 DIAGNOSIS — S43499A Other sprain of unspecified shoulder joint, initial encounter: Secondary | ICD-10-CM | POA: Diagnosis not present

## 2013-02-28 DIAGNOSIS — S46819A Strain of other muscles, fascia and tendons at shoulder and upper arm level, unspecified arm, initial encounter: Secondary | ICD-10-CM | POA: Diagnosis not present

## 2013-03-07 DIAGNOSIS — M25819 Other specified joint disorders, unspecified shoulder: Secondary | ICD-10-CM | POA: Diagnosis not present

## 2013-03-07 DIAGNOSIS — M25519 Pain in unspecified shoulder: Secondary | ICD-10-CM | POA: Diagnosis not present

## 2013-04-20 DIAGNOSIS — I10 Essential (primary) hypertension: Secondary | ICD-10-CM | POA: Diagnosis not present

## 2013-04-20 DIAGNOSIS — E7800 Pure hypercholesterolemia, unspecified: Secondary | ICD-10-CM | POA: Diagnosis not present

## 2013-04-20 DIAGNOSIS — E119 Type 2 diabetes mellitus without complications: Secondary | ICD-10-CM | POA: Diagnosis not present

## 2013-04-20 DIAGNOSIS — I2510 Atherosclerotic heart disease of native coronary artery without angina pectoris: Secondary | ICD-10-CM | POA: Diagnosis not present

## 2013-07-27 DIAGNOSIS — M19019 Primary osteoarthritis, unspecified shoulder: Secondary | ICD-10-CM | POA: Diagnosis not present

## 2013-07-27 DIAGNOSIS — M4690 Unspecified inflammatory spondylopathy, site unspecified: Secondary | ICD-10-CM | POA: Diagnosis not present

## 2013-07-27 DIAGNOSIS — M25819 Other specified joint disorders, unspecified shoulder: Secondary | ICD-10-CM | POA: Diagnosis not present

## 2013-07-27 DIAGNOSIS — M1710 Unilateral primary osteoarthritis, unspecified knee: Secondary | ICD-10-CM | POA: Diagnosis not present

## 2013-11-15 DIAGNOSIS — N508 Other specified disorders of male genital organs: Secondary | ICD-10-CM | POA: Diagnosis not present

## 2013-11-15 DIAGNOSIS — Z0000 Encounter for general adult medical examination without abnormal findings: Secondary | ICD-10-CM | POA: Diagnosis not present

## 2013-11-17 DIAGNOSIS — D495 Neoplasm of unspecified behavior of other genitourinary organs: Secondary | ICD-10-CM | POA: Diagnosis not present

## 2013-11-17 DIAGNOSIS — N401 Enlarged prostate with lower urinary tract symptoms: Secondary | ICD-10-CM | POA: Diagnosis not present

## 2013-11-17 DIAGNOSIS — N138 Other obstructive and reflux uropathy: Secondary | ICD-10-CM | POA: Diagnosis not present

## 2013-11-17 DIAGNOSIS — R358 Other polyuria: Secondary | ICD-10-CM | POA: Diagnosis not present

## 2013-11-19 DIAGNOSIS — R079 Chest pain, unspecified: Secondary | ICD-10-CM | POA: Diagnosis not present

## 2013-11-19 DIAGNOSIS — G479 Sleep disorder, unspecified: Secondary | ICD-10-CM | POA: Diagnosis not present

## 2013-11-19 DIAGNOSIS — N4341 Spermatocele of epididymis, single: Secondary | ICD-10-CM | POA: Diagnosis not present

## 2013-11-19 DIAGNOSIS — N200 Calculus of kidney: Secondary | ICD-10-CM | POA: Diagnosis not present

## 2013-11-19 DIAGNOSIS — D696 Thrombocytopenia, unspecified: Secondary | ICD-10-CM | POA: Diagnosis present

## 2013-11-19 DIAGNOSIS — G4733 Obstructive sleep apnea (adult) (pediatric): Secondary | ICD-10-CM | POA: Diagnosis present

## 2013-11-19 DIAGNOSIS — R339 Retention of urine, unspecified: Secondary | ICD-10-CM | POA: Diagnosis not present

## 2013-11-19 DIAGNOSIS — A4151 Sepsis due to Escherichia coli [E. coli]: Secondary | ICD-10-CM | POA: Diagnosis not present

## 2013-11-19 DIAGNOSIS — A419 Sepsis, unspecified organism: Secondary | ICD-10-CM | POA: Diagnosis not present

## 2013-11-19 DIAGNOSIS — N432 Other hydrocele: Secondary | ICD-10-CM | POA: Diagnosis not present

## 2013-11-19 DIAGNOSIS — N4342 Spermatocele of epididymis, multiple: Secondary | ICD-10-CM | POA: Diagnosis present

## 2013-11-19 DIAGNOSIS — Z951 Presence of aortocoronary bypass graft: Secondary | ICD-10-CM | POA: Diagnosis not present

## 2013-11-19 DIAGNOSIS — J42 Unspecified chronic bronchitis: Secondary | ICD-10-CM | POA: Diagnosis not present

## 2013-11-19 DIAGNOSIS — N179 Acute kidney failure, unspecified: Secondary | ICD-10-CM | POA: Diagnosis not present

## 2013-11-19 DIAGNOSIS — R509 Fever, unspecified: Secondary | ICD-10-CM | POA: Diagnosis not present

## 2013-11-19 DIAGNOSIS — N433 Hydrocele, unspecified: Secondary | ICD-10-CM | POA: Diagnosis not present

## 2013-11-19 DIAGNOSIS — Z87891 Personal history of nicotine dependence: Secondary | ICD-10-CM | POA: Diagnosis not present

## 2013-11-19 DIAGNOSIS — M6281 Muscle weakness (generalized): Secondary | ICD-10-CM | POA: Diagnosis not present

## 2013-11-19 DIAGNOSIS — K4090 Unilateral inguinal hernia, without obstruction or gangrene, not specified as recurrent: Secondary | ICD-10-CM | POA: Diagnosis present

## 2013-11-19 DIAGNOSIS — N401 Enlarged prostate with lower urinary tract symptoms: Secondary | ICD-10-CM | POA: Diagnosis present

## 2013-11-19 DIAGNOSIS — E119 Type 2 diabetes mellitus without complications: Secondary | ICD-10-CM | POA: Diagnosis present

## 2013-11-19 DIAGNOSIS — J9811 Atelectasis: Secondary | ICD-10-CM | POA: Diagnosis not present

## 2013-11-19 DIAGNOSIS — R338 Other retention of urine: Secondary | ICD-10-CM | POA: Diagnosis present

## 2013-11-19 DIAGNOSIS — R109 Unspecified abdominal pain: Secondary | ICD-10-CM | POA: Diagnosis not present

## 2013-11-19 DIAGNOSIS — I10 Essential (primary) hypertension: Secondary | ICD-10-CM | POA: Diagnosis present

## 2013-11-19 DIAGNOSIS — E860 Dehydration: Secondary | ICD-10-CM | POA: Diagnosis not present

## 2013-11-19 DIAGNOSIS — B9620 Unspecified Escherichia coli [E. coli] as the cause of diseases classified elsewhere: Secondary | ICD-10-CM | POA: Diagnosis present

## 2013-11-19 DIAGNOSIS — R6520 Severe sepsis without septic shock: Secondary | ICD-10-CM | POA: Diagnosis present

## 2013-11-19 DIAGNOSIS — N390 Urinary tract infection, site not specified: Secondary | ICD-10-CM | POA: Diagnosis not present

## 2013-11-19 DIAGNOSIS — N508 Other specified disorders of male genital organs: Secondary | ICD-10-CM | POA: Diagnosis not present

## 2013-11-19 DIAGNOSIS — I2510 Atherosclerotic heart disease of native coronary artery without angina pectoris: Secondary | ICD-10-CM | POA: Diagnosis present

## 2013-11-19 DIAGNOSIS — I252 Old myocardial infarction: Secondary | ICD-10-CM | POA: Diagnosis not present

## 2013-11-28 DIAGNOSIS — B9620 Unspecified Escherichia coli [E. coli] as the cause of diseases classified elsewhere: Secondary | ICD-10-CM | POA: Diagnosis not present

## 2013-11-28 DIAGNOSIS — N390 Urinary tract infection, site not specified: Secondary | ICD-10-CM | POA: Diagnosis not present

## 2013-11-28 DIAGNOSIS — N200 Calculus of kidney: Secondary | ICD-10-CM | POA: Diagnosis not present

## 2013-11-28 DIAGNOSIS — Z436 Encounter for attention to other artificial openings of urinary tract: Secondary | ICD-10-CM | POA: Diagnosis not present

## 2013-11-28 DIAGNOSIS — E119 Type 2 diabetes mellitus without complications: Secondary | ICD-10-CM | POA: Diagnosis not present

## 2013-11-28 DIAGNOSIS — R339 Retention of urine, unspecified: Secondary | ICD-10-CM | POA: Diagnosis not present

## 2013-11-28 DIAGNOSIS — I10 Essential (primary) hypertension: Secondary | ICD-10-CM | POA: Diagnosis not present

## 2013-11-28 DIAGNOSIS — Z794 Long term (current) use of insulin: Secondary | ICD-10-CM | POA: Diagnosis not present

## 2013-11-29 DIAGNOSIS — N138 Other obstructive and reflux uropathy: Secondary | ICD-10-CM | POA: Diagnosis not present

## 2013-11-29 DIAGNOSIS — L729 Follicular cyst of the skin and subcutaneous tissue, unspecified: Secondary | ICD-10-CM | POA: Diagnosis not present

## 2013-11-29 DIAGNOSIS — K644 Residual hemorrhoidal skin tags: Secondary | ICD-10-CM | POA: Diagnosis not present

## 2013-11-29 DIAGNOSIS — R339 Retention of urine, unspecified: Secondary | ICD-10-CM | POA: Diagnosis not present

## 2013-11-29 DIAGNOSIS — N289 Disorder of kidney and ureter, unspecified: Secondary | ICD-10-CM | POA: Diagnosis not present

## 2013-11-29 DIAGNOSIS — N10 Acute tubulo-interstitial nephritis: Secondary | ICD-10-CM | POA: Diagnosis not present

## 2013-11-29 DIAGNOSIS — K4090 Unilateral inguinal hernia, without obstruction or gangrene, not specified as recurrent: Secondary | ICD-10-CM | POA: Diagnosis not present

## 2013-11-29 DIAGNOSIS — N401 Enlarged prostate with lower urinary tract symptoms: Secondary | ICD-10-CM | POA: Diagnosis not present

## 2013-12-08 DIAGNOSIS — R339 Retention of urine, unspecified: Secondary | ICD-10-CM | POA: Diagnosis not present

## 2013-12-08 DIAGNOSIS — K4090 Unilateral inguinal hernia, without obstruction or gangrene, not specified as recurrent: Secondary | ICD-10-CM | POA: Diagnosis not present

## 2013-12-15 DIAGNOSIS — N289 Disorder of kidney and ureter, unspecified: Secondary | ICD-10-CM | POA: Diagnosis not present

## 2013-12-15 DIAGNOSIS — R300 Dysuria: Secondary | ICD-10-CM | POA: Diagnosis not present

## 2013-12-15 DIAGNOSIS — R972 Elevated prostate specific antigen [PSA]: Secondary | ICD-10-CM | POA: Diagnosis not present

## 2013-12-20 DIAGNOSIS — N401 Enlarged prostate with lower urinary tract symptoms: Secondary | ICD-10-CM | POA: Diagnosis not present

## 2013-12-20 DIAGNOSIS — N138 Other obstructive and reflux uropathy: Secondary | ICD-10-CM | POA: Diagnosis not present

## 2013-12-20 DIAGNOSIS — N4341 Spermatocele of epididymis, single: Secondary | ICD-10-CM | POA: Diagnosis not present

## 2013-12-28 DIAGNOSIS — K529 Noninfective gastroenteritis and colitis, unspecified: Secondary | ICD-10-CM | POA: Diagnosis not present

## 2014-01-27 DIAGNOSIS — Z794 Long term (current) use of insulin: Secondary | ICD-10-CM | POA: Diagnosis not present

## 2014-01-27 DIAGNOSIS — I10 Essential (primary) hypertension: Secondary | ICD-10-CM | POA: Diagnosis not present

## 2014-01-27 DIAGNOSIS — Z436 Encounter for attention to other artificial openings of urinary tract: Secondary | ICD-10-CM | POA: Diagnosis not present

## 2014-01-27 DIAGNOSIS — E119 Type 2 diabetes mellitus without complications: Secondary | ICD-10-CM | POA: Diagnosis not present

## 2014-01-27 DIAGNOSIS — Z48815 Encounter for surgical aftercare following surgery on the digestive system: Secondary | ICD-10-CM | POA: Diagnosis not present

## 2014-01-27 DIAGNOSIS — N200 Calculus of kidney: Secondary | ICD-10-CM | POA: Diagnosis not present

## 2014-01-27 DIAGNOSIS — R339 Retention of urine, unspecified: Secondary | ICD-10-CM | POA: Diagnosis not present

## 2014-01-27 HISTORY — PX: OTHER SURGICAL HISTORY: SHX169

## 2014-01-30 DIAGNOSIS — D1779 Benign lipomatous neoplasm of other sites: Secondary | ICD-10-CM | POA: Diagnosis not present

## 2014-01-30 DIAGNOSIS — Z7982 Long term (current) use of aspirin: Secondary | ICD-10-CM | POA: Diagnosis not present

## 2014-01-30 DIAGNOSIS — L720 Epidermal cyst: Secondary | ICD-10-CM | POA: Diagnosis not present

## 2014-01-30 DIAGNOSIS — M158 Other polyosteoarthritis: Secondary | ICD-10-CM | POA: Diagnosis not present

## 2014-01-30 DIAGNOSIS — I2510 Atherosclerotic heart disease of native coronary artery without angina pectoris: Secondary | ICD-10-CM | POA: Diagnosis not present

## 2014-01-30 DIAGNOSIS — N4340 Spermatocele of epididymis, unspecified: Secondary | ICD-10-CM | POA: Diagnosis not present

## 2014-01-30 DIAGNOSIS — E119 Type 2 diabetes mellitus without complications: Secondary | ICD-10-CM | POA: Diagnosis not present

## 2014-01-30 DIAGNOSIS — Z79899 Other long term (current) drug therapy: Secondary | ICD-10-CM | POA: Diagnosis not present

## 2014-01-30 DIAGNOSIS — Z951 Presence of aortocoronary bypass graft: Secondary | ICD-10-CM | POA: Diagnosis not present

## 2014-01-30 DIAGNOSIS — K4030 Unilateral inguinal hernia, with obstruction, without gangrene, not specified as recurrent: Secondary | ICD-10-CM | POA: Diagnosis not present

## 2014-01-30 DIAGNOSIS — N433 Hydrocele, unspecified: Secondary | ICD-10-CM | POA: Diagnosis not present

## 2014-01-30 DIAGNOSIS — Z87891 Personal history of nicotine dependence: Secondary | ICD-10-CM | POA: Diagnosis not present

## 2014-01-30 DIAGNOSIS — Z955 Presence of coronary angioplasty implant and graft: Secondary | ICD-10-CM | POA: Diagnosis not present

## 2014-01-30 DIAGNOSIS — Z794 Long term (current) use of insulin: Secondary | ICD-10-CM | POA: Diagnosis not present

## 2014-02-01 DIAGNOSIS — L720 Epidermal cyst: Secondary | ICD-10-CM | POA: Diagnosis not present

## 2014-02-01 DIAGNOSIS — N433 Hydrocele, unspecified: Secondary | ICD-10-CM | POA: Diagnosis not present

## 2014-02-01 DIAGNOSIS — Z79899 Other long term (current) drug therapy: Secondary | ICD-10-CM | POA: Diagnosis not present

## 2014-02-01 DIAGNOSIS — K4030 Unilateral inguinal hernia, with obstruction, without gangrene, not specified as recurrent: Secondary | ICD-10-CM | POA: Diagnosis not present

## 2014-02-01 DIAGNOSIS — N4341 Spermatocele of epididymis, single: Secondary | ICD-10-CM | POA: Diagnosis not present

## 2014-02-01 DIAGNOSIS — N4340 Spermatocele of epididymis, unspecified: Secondary | ICD-10-CM | POA: Diagnosis not present

## 2014-02-01 DIAGNOSIS — K4090 Unilateral inguinal hernia, without obstruction or gangrene, not specified as recurrent: Secondary | ICD-10-CM | POA: Diagnosis not present

## 2014-02-01 DIAGNOSIS — D1779 Benign lipomatous neoplasm of other sites: Secondary | ICD-10-CM | POA: Diagnosis not present

## 2014-02-02 DIAGNOSIS — N200 Calculus of kidney: Secondary | ICD-10-CM | POA: Diagnosis not present

## 2014-02-02 DIAGNOSIS — E119 Type 2 diabetes mellitus without complications: Secondary | ICD-10-CM | POA: Diagnosis not present

## 2014-02-02 DIAGNOSIS — Z794 Long term (current) use of insulin: Secondary | ICD-10-CM | POA: Diagnosis not present

## 2014-02-02 DIAGNOSIS — R339 Retention of urine, unspecified: Secondary | ICD-10-CM | POA: Diagnosis not present

## 2014-02-02 DIAGNOSIS — Z436 Encounter for attention to other artificial openings of urinary tract: Secondary | ICD-10-CM | POA: Diagnosis not present

## 2014-02-02 DIAGNOSIS — I10 Essential (primary) hypertension: Secondary | ICD-10-CM | POA: Diagnosis not present

## 2014-02-07 DIAGNOSIS — Z794 Long term (current) use of insulin: Secondary | ICD-10-CM | POA: Diagnosis not present

## 2014-02-07 DIAGNOSIS — D1779 Benign lipomatous neoplasm of other sites: Secondary | ICD-10-CM | POA: Diagnosis not present

## 2014-02-07 DIAGNOSIS — R339 Retention of urine, unspecified: Secondary | ICD-10-CM | POA: Diagnosis not present

## 2014-02-07 DIAGNOSIS — N499 Inflammatory disorder of unspecified male genital organ: Secondary | ICD-10-CM | POA: Diagnosis not present

## 2014-02-07 DIAGNOSIS — N200 Calculus of kidney: Secondary | ICD-10-CM | POA: Diagnosis not present

## 2014-02-07 DIAGNOSIS — I10 Essential (primary) hypertension: Secondary | ICD-10-CM | POA: Diagnosis not present

## 2014-02-07 DIAGNOSIS — Z436 Encounter for attention to other artificial openings of urinary tract: Secondary | ICD-10-CM | POA: Diagnosis not present

## 2014-02-07 DIAGNOSIS — E119 Type 2 diabetes mellitus without complications: Secondary | ICD-10-CM | POA: Diagnosis not present

## 2014-02-10 DIAGNOSIS — Z436 Encounter for attention to other artificial openings of urinary tract: Secondary | ICD-10-CM | POA: Diagnosis not present

## 2014-02-10 DIAGNOSIS — I10 Essential (primary) hypertension: Secondary | ICD-10-CM | POA: Diagnosis not present

## 2014-02-10 DIAGNOSIS — Z794 Long term (current) use of insulin: Secondary | ICD-10-CM | POA: Diagnosis not present

## 2014-02-10 DIAGNOSIS — R339 Retention of urine, unspecified: Secondary | ICD-10-CM | POA: Diagnosis not present

## 2014-02-10 DIAGNOSIS — N200 Calculus of kidney: Secondary | ICD-10-CM | POA: Diagnosis not present

## 2014-02-10 DIAGNOSIS — E119 Type 2 diabetes mellitus without complications: Secondary | ICD-10-CM | POA: Diagnosis not present

## 2014-02-14 DIAGNOSIS — R339 Retention of urine, unspecified: Secondary | ICD-10-CM | POA: Diagnosis not present

## 2014-02-14 DIAGNOSIS — I10 Essential (primary) hypertension: Secondary | ICD-10-CM | POA: Diagnosis not present

## 2014-02-14 DIAGNOSIS — N200 Calculus of kidney: Secondary | ICD-10-CM | POA: Diagnosis not present

## 2014-02-14 DIAGNOSIS — Z436 Encounter for attention to other artificial openings of urinary tract: Secondary | ICD-10-CM | POA: Diagnosis not present

## 2014-02-14 DIAGNOSIS — Z794 Long term (current) use of insulin: Secondary | ICD-10-CM | POA: Diagnosis not present

## 2014-02-14 DIAGNOSIS — E119 Type 2 diabetes mellitus without complications: Secondary | ICD-10-CM | POA: Diagnosis not present

## 2014-02-21 DIAGNOSIS — Z794 Long term (current) use of insulin: Secondary | ICD-10-CM | POA: Diagnosis not present

## 2014-02-21 DIAGNOSIS — Z436 Encounter for attention to other artificial openings of urinary tract: Secondary | ICD-10-CM | POA: Diagnosis not present

## 2014-02-21 DIAGNOSIS — I10 Essential (primary) hypertension: Secondary | ICD-10-CM | POA: Diagnosis not present

## 2014-02-21 DIAGNOSIS — R339 Retention of urine, unspecified: Secondary | ICD-10-CM | POA: Diagnosis not present

## 2014-02-21 DIAGNOSIS — E119 Type 2 diabetes mellitus without complications: Secondary | ICD-10-CM | POA: Diagnosis not present

## 2014-02-21 DIAGNOSIS — N200 Calculus of kidney: Secondary | ICD-10-CM | POA: Diagnosis not present

## 2014-02-28 DIAGNOSIS — N289 Disorder of kidney and ureter, unspecified: Secondary | ICD-10-CM | POA: Diagnosis not present

## 2014-02-28 DIAGNOSIS — N200 Calculus of kidney: Secondary | ICD-10-CM | POA: Diagnosis not present

## 2014-03-01 DIAGNOSIS — R339 Retention of urine, unspecified: Secondary | ICD-10-CM | POA: Diagnosis not present

## 2014-03-01 DIAGNOSIS — Z794 Long term (current) use of insulin: Secondary | ICD-10-CM | POA: Diagnosis not present

## 2014-03-01 DIAGNOSIS — N200 Calculus of kidney: Secondary | ICD-10-CM | POA: Diagnosis not present

## 2014-03-01 DIAGNOSIS — E119 Type 2 diabetes mellitus without complications: Secondary | ICD-10-CM | POA: Diagnosis not present

## 2014-03-01 DIAGNOSIS — I10 Essential (primary) hypertension: Secondary | ICD-10-CM | POA: Diagnosis not present

## 2014-03-01 DIAGNOSIS — Z436 Encounter for attention to other artificial openings of urinary tract: Secondary | ICD-10-CM | POA: Diagnosis not present

## 2014-03-28 DIAGNOSIS — N200 Calculus of kidney: Secondary | ICD-10-CM | POA: Diagnosis not present

## 2014-05-18 DIAGNOSIS — E1165 Type 2 diabetes mellitus with hyperglycemia: Secondary | ICD-10-CM | POA: Diagnosis not present

## 2014-05-18 DIAGNOSIS — E1129 Type 2 diabetes mellitus with other diabetic kidney complication: Secondary | ICD-10-CM | POA: Diagnosis not present

## 2014-05-18 DIAGNOSIS — M170 Bilateral primary osteoarthritis of knee: Secondary | ICD-10-CM | POA: Diagnosis not present

## 2014-05-18 DIAGNOSIS — E782 Mixed hyperlipidemia: Secondary | ICD-10-CM | POA: Diagnosis not present

## 2014-05-18 DIAGNOSIS — Z79899 Other long term (current) drug therapy: Secondary | ICD-10-CM | POA: Diagnosis not present

## 2014-05-18 DIAGNOSIS — N183 Chronic kidney disease, stage 3 (moderate): Secondary | ICD-10-CM | POA: Diagnosis not present

## 2014-05-30 DIAGNOSIS — N200 Calculus of kidney: Secondary | ICD-10-CM | POA: Diagnosis not present

## 2014-05-30 DIAGNOSIS — N401 Enlarged prostate with lower urinary tract symptoms: Secondary | ICD-10-CM | POA: Diagnosis not present

## 2014-05-30 DIAGNOSIS — N138 Other obstructive and reflux uropathy: Secondary | ICD-10-CM | POA: Diagnosis not present

## 2014-06-05 DIAGNOSIS — H66005 Acute suppurative otitis media without spontaneous rupture of ear drum, recurrent, left ear: Secondary | ICD-10-CM | POA: Diagnosis not present

## 2014-06-06 DIAGNOSIS — M1711 Unilateral primary osteoarthritis, right knee: Secondary | ICD-10-CM | POA: Diagnosis not present

## 2014-06-06 DIAGNOSIS — M25512 Pain in left shoulder: Secondary | ICD-10-CM | POA: Diagnosis not present

## 2014-06-20 DIAGNOSIS — M1711 Unilateral primary osteoarthritis, right knee: Secondary | ICD-10-CM | POA: Diagnosis not present

## 2014-06-20 DIAGNOSIS — M1712 Unilateral primary osteoarthritis, left knee: Secondary | ICD-10-CM | POA: Diagnosis not present

## 2014-06-20 DIAGNOSIS — N138 Other obstructive and reflux uropathy: Secondary | ICD-10-CM | POA: Diagnosis not present

## 2014-06-20 DIAGNOSIS — N401 Enlarged prostate with lower urinary tract symptoms: Secondary | ICD-10-CM | POA: Diagnosis not present

## 2014-06-20 DIAGNOSIS — N200 Calculus of kidney: Secondary | ICD-10-CM | POA: Diagnosis not present

## 2014-06-27 DIAGNOSIS — M1711 Unilateral primary osteoarthritis, right knee: Secondary | ICD-10-CM | POA: Diagnosis not present

## 2014-06-27 DIAGNOSIS — M1712 Unilateral primary osteoarthritis, left knee: Secondary | ICD-10-CM | POA: Diagnosis not present

## 2014-07-04 DIAGNOSIS — M1711 Unilateral primary osteoarthritis, right knee: Secondary | ICD-10-CM | POA: Diagnosis not present

## 2014-07-04 DIAGNOSIS — M1712 Unilateral primary osteoarthritis, left knee: Secondary | ICD-10-CM | POA: Diagnosis not present

## 2014-07-11 DIAGNOSIS — N200 Calculus of kidney: Secondary | ICD-10-CM | POA: Diagnosis not present

## 2014-07-11 DIAGNOSIS — N289 Disorder of kidney and ureter, unspecified: Secondary | ICD-10-CM | POA: Diagnosis not present

## 2014-07-11 DIAGNOSIS — N23 Unspecified renal colic: Secondary | ICD-10-CM | POA: Diagnosis not present

## 2014-07-11 DIAGNOSIS — R312 Other microscopic hematuria: Secondary | ICD-10-CM | POA: Diagnosis not present

## 2014-07-27 DIAGNOSIS — N200 Calculus of kidney: Secondary | ICD-10-CM | POA: Diagnosis not present

## 2014-08-08 DIAGNOSIS — Z794 Long term (current) use of insulin: Secondary | ICD-10-CM | POA: Diagnosis not present

## 2014-08-08 DIAGNOSIS — E1122 Type 2 diabetes mellitus with diabetic chronic kidney disease: Secondary | ICD-10-CM | POA: Diagnosis not present

## 2014-08-08 DIAGNOSIS — N183 Chronic kidney disease, stage 3 (moderate): Secondary | ICD-10-CM | POA: Diagnosis not present

## 2014-08-08 DIAGNOSIS — E1165 Type 2 diabetes mellitus with hyperglycemia: Secondary | ICD-10-CM | POA: Diagnosis not present

## 2014-08-17 DIAGNOSIS — M1711 Unilateral primary osteoarthritis, right knee: Secondary | ICD-10-CM | POA: Diagnosis not present

## 2014-08-17 DIAGNOSIS — M1712 Unilateral primary osteoarthritis, left knee: Secondary | ICD-10-CM | POA: Diagnosis not present

## 2014-08-17 DIAGNOSIS — N200 Calculus of kidney: Secondary | ICD-10-CM | POA: Diagnosis not present

## 2014-08-21 DIAGNOSIS — Z794 Long term (current) use of insulin: Secondary | ICD-10-CM | POA: Diagnosis not present

## 2014-08-21 DIAGNOSIS — N200 Calculus of kidney: Secondary | ICD-10-CM | POA: Diagnosis not present

## 2014-08-21 DIAGNOSIS — I493 Ventricular premature depolarization: Secondary | ICD-10-CM | POA: Diagnosis not present

## 2014-08-21 DIAGNOSIS — E119 Type 2 diabetes mellitus without complications: Secondary | ICD-10-CM | POA: Diagnosis not present

## 2014-08-21 DIAGNOSIS — Z951 Presence of aortocoronary bypass graft: Secondary | ICD-10-CM | POA: Diagnosis not present

## 2014-08-21 DIAGNOSIS — Z79899 Other long term (current) drug therapy: Secondary | ICD-10-CM | POA: Diagnosis not present

## 2014-08-21 DIAGNOSIS — I252 Old myocardial infarction: Secondary | ICD-10-CM | POA: Diagnosis not present

## 2014-08-21 DIAGNOSIS — I10 Essential (primary) hypertension: Secondary | ICD-10-CM | POA: Diagnosis not present

## 2014-08-21 HISTORY — PX: LITHOTRIPSY: SUR834

## 2014-08-29 DIAGNOSIS — N200 Calculus of kidney: Secondary | ICD-10-CM | POA: Diagnosis not present

## 2014-09-04 DIAGNOSIS — M1711 Unilateral primary osteoarthritis, right knee: Secondary | ICD-10-CM | POA: Diagnosis not present

## 2014-09-04 DIAGNOSIS — M1712 Unilateral primary osteoarthritis, left knee: Secondary | ICD-10-CM | POA: Diagnosis not present

## 2014-09-15 DIAGNOSIS — M1712 Unilateral primary osteoarthritis, left knee: Secondary | ICD-10-CM | POA: Diagnosis not present

## 2014-09-28 DIAGNOSIS — Z01818 Encounter for other preprocedural examination: Secondary | ICD-10-CM | POA: Diagnosis not present

## 2014-09-28 DIAGNOSIS — M170 Bilateral primary osteoarthritis of knee: Secondary | ICD-10-CM | POA: Diagnosis not present

## 2014-10-04 NOTE — H&P (Signed)
TOTAL KNEE ADMISSION H&P  Patient is being admitted for left total knee arthroplasty.  Subjective:  Chief Complaint:      Left knee primary OA / pain.  HPI: Jeffrey Kelly, 80 y.o. male, has a history of pain and functional disability in the left knee due to arthritis and has failed non-surgical conservative treatments for greater than 12 weeks to include NSAID's and/or analgesics, corticosteriod injections, viscosupplementation injections and activity modification.  Onset of symptoms was gradual, starting 2+ years ago with gradually worsening course since that time. The patient noted no past surgery on the left knee(s).  Patient currently rates pain in the left knee(s) at 10 out of 10 with activity. Patient has night pain, worsening of pain with activity and weight bearing, pain that interferes with activities of daily living, pain with passive range of motion, crepitus and joint swelling.  Patient has evidence of periarticular osteophytes and joint space narrowing by imaging studies. There is no active infection.  Risks, benefits and expectations were discussed with the patient.  Risks including but not limited to the risk of anesthesia, blood clots, nerve damage, blood vessel damage, failure of the prosthesis, infection and up to and including death.  Patient understand the risks, benefits and expectations and wishes to proceed with surgery.   PCP: No primary care provider on file.  D/C Plans:      Home with HHPT  Post-op Meds:       No Rx given   Tranexamic Acid:      To be given - IV  Decadron:      Is to be given  FYI:     ASA post-op  Norco post-op  Dilaudid ok per pt ( even with Morphine allergy)  DME - No needs     Past Medical History  Diagnosis Date  . Myocardial infarction 2004  . Dysrhythmia   . Shortness of breath dyspnea     with exertion   . Diabetes mellitus without complication   . GERD (gastroesophageal reflux disease)   . Arthritis   . Urinary tract infection     hx of hopsitalization 10/2013      Past Surgical History  Procedure Laterality Date  . Coronary artery bypass graft  2004  . Vasectomy  1999  . Right knee torn cartilage surgery   1970  . Left ear surgery   1980    x 2   . Coronary angioplasty  1993  . Abdominal angiogram  1994  . Abdominal aortic aneurysm repair  1996   . Scrotal cyst removed   1999  . Cholecystectomy  2004  . Left knee scope   2007   . Back surgery  2002    2008 and 2002   . Trigger finer release   2011   . Colonoscopy  2012   . Hernia repair      02/01/2014    . Left hydrocelectomy   01/2014   . Left spermatocelectomy   01/2014   . Excision of scrotal lipoma     . Lithotripsy  08/21/2014      No prescriptions prior to admission   Allergies  Allergen Reactions  . Morphine And Related Nausea And Vomiting  . Proctofoam [Pramoxine Hcl]     Blisters skin  . Proctosol [Hydrocortisone]     Blisters skin    Social History  Substance Use Topics  . Smoking status: Former Research scientist (life sciences)  . Smokeless tobacco: Never Used  . Alcohol Use: No  Comment: hx of 20 years ago        Review of Systems  Constitutional: Positive for fever.  HENT: Positive for hearing loss and tinnitus.   Eyes: Negative.   Respiratory: Positive for shortness of breath (on exertion).   Cardiovascular: Negative.   Gastrointestinal: Positive for heartburn.  Genitourinary: Negative.   Musculoskeletal: Positive for back pain and joint pain.  Skin: Negative.   Neurological: Negative.   Endo/Heme/Allergies: Positive for environmental allergies.  Psychiatric/Behavioral: Negative.     Objective:  Physical Exam  Constitutional: He is oriented to person, place, and time. He appears well-developed and well-nourished.  HENT:  Head: Normocephalic and atraumatic.  Eyes: Pupils are equal, round, and reactive to light.  Neck: Neck supple. No JVD present. No tracheal deviation present. No thyromegaly present.  Cardiovascular: Normal rate,  regular rhythm, normal heart sounds and intact distal pulses.   Respiratory: Effort normal and breath sounds normal. No stridor. No respiratory distress. He has no wheezes.  GI: Soft. There is no tenderness. There is no guarding.  Musculoskeletal:       Left knee: He exhibits decreased range of motion, swelling and bony tenderness. He exhibits no ecchymosis, no deformity, no laceration and no erythema. Tenderness found.  Lymphadenopathy:    He has no cervical adenopathy.  Neurological: He is alert and oriented to person, place, and time.  Skin: Skin is warm and dry.  Psychiatric: He has a normal mood and affect.     Imaging Review Plain radiographs demonstrate severe degenerative joint disease of the left knee(s). The overall alignment is mild varus. The bone quality appears to be good for age and reported activity level.  Assessment/Plan:  End stage arthritis, left knee   The patient history, physical examination, clinical judgment of the provider and imaging studies are consistent with end stage degenerative joint disease of the left knee(s) and total knee arthroplasty is deemed medically necessary. The treatment options including medical management, injection therapy arthroscopy and arthroplasty were discussed at length. The risks and benefits of total knee arthroplasty were presented and reviewed. The risks due to aseptic loosening, infection, stiffness, patella tracking problems, thromboembolic complications and other imponderables were discussed. The patient acknowledged the explanation, agreed to proceed with the plan and consent was signed. Patient is being admitted for inpatient treatment for surgery, pain control, PT, OT, prophylactic antibiotics, VTE prophylaxis, progressive ambulation and ADL's and discharge planning. The patient is planning to be discharged home with home health services.    [name]   PA-C  10/12/2014, 3:06 PM

## 2014-10-09 NOTE — Patient Instructions (Addendum)
Jeffrey Kelly  10/09/2014   Your procedure is scheduled on:   10/17/2014    Report to [facility] Main  Entrance take [facility]  elevators to 3rd floor to  [place] at    0600 AM.  Call this number if you have problems the morning of surgery [phone]   Remember: ONLY 1 PERSON MAY GO WITH YOU TO SHORT STAY TO GET  READY MORNING OF [place].  Do not eat food or drink liquids :After Midnight.             Remember to eat a good healthy snack prior to bedtime.               Take 1/2 of evening dose of Insulin nite before surgery.    No diabetic medications am of surgery .     Take these medicines the morning of surgery with A SIP OF WATER:   Metoprolol ( Lopressor), Zantac, Flomax                                You may not have any metal on your body including hair pins and              piercings  Do not wear jewelry,, lotions, powders or perfumes, deodorant                         Men may shave face and neck.   Do not bring valuables to the hospital. No Name.  Contacts, dentures or bridgework may not be worn into surgery.  Leave suitcase in the car. After surgery it may be brought to your room.     Special Instructions: coughing and deep breathing exercises, leg exercises               Please read over the following fact sheets you were given: _____________________________________________________________________             [facility] - Preparing for Surgery Before surgery, you can play an important role.  Because skin is not sterile, your skin needs to be as free of germs as possible.  You can reduce the number of germs on your skin by washing with CHG (chlorahexidine gluconate) soap before surgery.  CHG is an antiseptic cleaner which kills germs and bonds with the skin to continue killing germs even after washing. Please DO NOT use if you have an allergy to CHG or antibacterial soaps.  If your skin  becomes reddened/irritated stop using the CHG and inform your nurse when you arrive at Short Stay. Do not shave (including legs and underarms) for at least 48 hours prior to the first CHG shower.  You may shave your face/neck. Please follow these instructions carefully:  1.  Shower with CHG Soap the night before surgery and the  morning of Surgery.  2.  If you choose to wash your hair, wash your hair first as usual with your  normal  shampoo.  3.  After you shampoo, rinse your hair and body thoroughly to remove the  shampoo.  4.  Use CHG as you would any other liquid soap.  You can apply chg directly  to the skin and wash                       Gently with a scrungie or clean washcloth.  5.  Apply the CHG Soap to your body ONLY FROM THE NECK DOWN.   Do not use on face/ open                           Wound or open sores. Avoid contact with eyes, ears mouth and genitals (private parts).                       Wash face,  Genitals (private parts) with your normal soap.             6.  Wash thoroughly, paying special attention to the area where your surgery  will be performed.  7.  Thoroughly rinse your body with warm water from the neck down.  8.  DO NOT shower/wash with your normal soap after using and rinsing off  the CHG Soap.                9.  Pat yourself dry with a clean towel.            10.  Wear clean pajamas.            11.  Place clean sheets on your bed the night of your first shower and do not  sleep with pets. Day of Surgery : Do not apply any lotions/deodorants the morning of surgery.  Please wear clean clothes to the hospital/surgery center.  FAILURE TO FOLLOW THESE INSTRUCTIONS MAY RESULT IN THE CANCELLATION OF YOUR SURGERY PATIENT SIGNATURE_________________________________  NURSE SIGNATURE__________________________________  ________________________________________________________________________  WHAT IS A BLOOD TRANSFUSION? Blood Transfusion  Information  A transfusion is the replacement of blood or some of its parts. Blood is made up of multiple cells which provide different functions.  Red blood cells carry oxygen and are used for blood loss replacement.  White blood cells fight against infection.  Platelets control bleeding.  Plasma helps clot blood.  Other blood products are available for specialized needs, such as hemophilia or other clotting disorders. BEFORE THE TRANSFUSION  Who gives blood for transfusions?   Healthy volunteers who are fully evaluated to make sure their blood is safe. This is blood bank blood. Transfusion therapy is the safest it has ever been in the practice of medicine. Before blood is taken from a donor, a complete history is taken to make sure that person has no history of diseases nor engages in risky social behavior (examples are intravenous drug use or sexual activity with multiple partners). The donor's travel history is screened to minimize risk of transmitting infections, such as malaria. The donated blood is tested for signs of infectious diseases, such as HIV and hepatitis. The blood is then tested to be sure it is compatible with you in order to minimize the chance of a transfusion reaction. If you or a relative donates blood, this is often done in anticipation of surgery and is not appropriate for emergency situations. It takes many days to process the donated blood. RISKS AND COMPLICATIONS Although transfusion therapy is very safe and saves many lives, the main dangers of transfusion include:  1. Getting an infectious disease. 2. Developing a transfusion reaction. This  is an allergic reaction to something in the blood you were given. Every precaution is taken to prevent this. The decision to have a blood transfusion has been considered carefully by your caregiver before blood is given. Blood is not given unless the benefits outweigh the risks. AFTER THE TRANSFUSION  Right after receiving a  blood transfusion, you will usually feel much better and more energetic. This is especially true if your red blood cells have gotten low (anemic). The transfusion raises the level of the red blood cells which carry oxygen, and this usually causes an energy increase.  The nurse administering the transfusion will monitor you carefully for complications. HOME CARE INSTRUCTIONS  No special instructions are needed after a transfusion. You may find your energy is better. Speak with your caregiver about any limitations on activity for underlying diseases you may have. SEEK MEDICAL CARE IF:   Your condition is not improving after your transfusion.  You develop redness or irritation at the intravenous (IV) site. SEEK IMMEDIATE MEDICAL CARE IF:  Any of the following symptoms occur over the next 12 hours:  Shaking chills.  You have a temperature by mouth above 102 F (38.9 C), not controlled by medicine.  Chest, back, or muscle pain.  People around you feel you are not acting correctly or are confused.  Shortness of breath or difficulty breathing.  Dizziness and fainting.  You get a rash or develop hives.  You have a decrease in urine output.  Your urine turns a dark color or changes to pink, red, or brown. Any of the following symptoms occur over the next 10 days:  You have a temperature by mouth above 102 F (38.9 C), not controlled by medicine.  Shortness of breath.  Weakness after normal activity.  The white part of the eye turns yellow (jaundice).  You have a decrease in the amount of urine or are urinating less often.  Your urine turns a dark color or changes to pink, red, or brown. Document Released: 01/11/2000 Document Revised: 04/07/2011 Document Reviewed: 08/30/2007 ExitCare Patient Information 2014 [place].  _______________________________________________________________________  Incentive Spirometer  An incentive spirometer is a tool that can help keep  your lungs clear and active. This tool measures how well you are filling your lungs with each breath. Taking long deep breaths may help reverse or decrease the chance of developing breathing (pulmonary) problems (especially infection) following:  A long period of time when you are unable to move or be active. BEFORE THE PROCEDURE   If the spirometer includes an indicator to show your best effort, your nurse or respiratory therapist will set it to a desired goal.  If possible, sit up straight or lean slightly forward. Try not to slouch.  Hold the incentive spirometer in an upright position. INSTRUCTIONS FOR USE  3. Sit on the edge of your bed if possible, or sit up as far as you can in bed or on a chair. 4. Hold the incentive spirometer in an upright position. 5. Breathe out normally. 6. Place the mouthpiece in your mouth and seal your lips tightly around it. 7. Breathe in slowly and as deeply as possible, raising the piston or the ball toward the top of the column. 8. Hold your breath for 3-5 seconds or for as long as possible. Allow the piston or ball to fall to the bottom of the column. 9. Remove the mouthpiece from your mouth and breathe out normally. 10. Rest for a few seconds and repeat Steps 1  through 7 at least 10 times every 1-2 hours when you are awake. Take your time and take a few normal breaths between deep breaths. 11. The spirometer may include an indicator to show your best effort. Use the indicator as a goal to work toward during each repetition. 12. After each set of 10 deep breaths, practice coughing to be sure your lungs are clear. If you have an incision (the cut made at the time of surgery), support your incision when coughing by placing a pillow or rolled up towels firmly against it. Once you are able to get out of bed, walk around indoors and cough well. You may stop using the incentive spirometer when instructed by your caregiver.  RISKS AND COMPLICATIONS  Take your  time so you do not get dizzy or light-headed.  If you are in pain, you may need to take or ask for pain medication before doing incentive spirometry. It is harder to take a deep breath if you are having pain. AFTER USE  Rest and breathe slowly and easily.  It can be helpful to keep track of a log of your progress. Your caregiver can provide you with a simple table to help with this. If you are using the spirometer at home, follow these instructions: [place] IF:   You are having difficultly using the spirometer.  You have trouble using the spirometer as often as instructed.  Your pain medication is not giving enough relief while using the spirometer.  You develop fever of 100.5 F (38.1 C) or higher. SEEK IMMEDIATE MEDICAL CARE IF:   You cough up bloody sputum that had not been present before.  You develop fever of 102 F (38.9 C) or greater.  You develop worsening pain at or near the incision site. MAKE SURE YOU:   Understand these instructions.  Will watch your condition.  Will get help right away if you are not doing well or get worse. Document Released: 05/26/2006 Document Revised: 04/07/2011 Document Reviewed: 07/27/2006 [facility] Patient Information 2014 [place], [place].   ________________________________________________________________________

## 2014-10-11 ENCOUNTER — Encounter (HOSPITAL_COMMUNITY): Payer: Self-pay

## 2014-10-11 ENCOUNTER — Encounter (HOSPITAL_COMMUNITY)
Admission: RE | Admit: 2014-10-11 | Discharge: 2014-10-11 | Disposition: A | Discharge status: Home / Self Care | Payer: Medicare Other | Source: Ambulatory Visit | Attending: Orthopedic Surgery | Admitting: Orthopedic Surgery

## 2014-10-11 DIAGNOSIS — M179 Osteoarthritis of knee, unspecified: Secondary | ICD-10-CM | POA: Diagnosis not present

## 2014-10-11 DIAGNOSIS — Z01818 Encounter for other preprocedural examination: Secondary | ICD-10-CM | POA: Diagnosis not present

## 2014-10-11 HISTORY — DX: Acute myocardial infarction, unspecified: I21.9

## 2014-10-11 HISTORY — DX: Unspecified osteoarthritis, unspecified site: M19.90

## 2014-10-11 HISTORY — DX: Type 2 diabetes mellitus without complications: E11.9

## 2014-10-11 HISTORY — DX: Reserved for inherently not codable concepts without codable children: IMO0001

## 2014-10-11 HISTORY — DX: Urinary tract infection, site not specified: N39.0

## 2014-10-11 HISTORY — DX: Gastro-esophageal reflux disease without esophagitis: K21.9

## 2014-10-11 HISTORY — DX: Cardiac arrhythmia, unspecified: I49.9

## 2014-10-11 LAB — URINALYSIS, ROUTINE W REFLEX MICROSCOPIC
Bilirubin Urine: NEGATIVE
Glucose, UA: >1000 mg/dL — AB
Hgb urine dipstick: NEGATIVE
KETONES UR: NEGATIVE mg/dL
LEUKOCYTES UA: NEGATIVE
NITRITE: NEGATIVE
PH: 5.5 (ref 5.0–8.0)
Protein, ur: NEGATIVE mg/dL
SPECIFIC GRAVITY, URINE: 1.022 (ref 1.005–1.030)
Urobilinogen, UA: 0.2 mg/dL (ref 0.0–1.0)

## 2014-10-11 LAB — SURGICAL PCR SCREEN
MRSA, PCR: POSITIVE — AB
STAPHYLOCOCCUS AUREUS: POSITIVE — AB

## 2014-10-11 LAB — BASIC METABOLIC PANEL
Anion gap: 5 (ref 5–15)
BUN: 28 mg/dL — ABNORMAL HIGH (ref 6–20)
CALCIUM: 9.6 mg/dL (ref 8.9–10.3)
CO2: 27 mmol/L (ref 22–32)
CREATININE: 1.52 mg/dL — AB (ref 0.61–1.24)
Chloride: 105 mmol/L (ref 101–111)
GFR calc Af Amer: 49 mL/min — ABNORMAL LOW (ref >60)
GFR calc non Af Amer: 42 mL/min — ABNORMAL LOW (ref >60)
GLUCOSE: 178 mg/dL — AB (ref 65–99)
Potassium: 5.3 mmol/L — ABNORMAL HIGH (ref 3.5–5.1)
Sodium: 137 mmol/L (ref 135–145)

## 2014-10-11 LAB — URINE MICROSCOPIC-ADD ON

## 2014-10-11 LAB — CBC
HCT: 45.1 % (ref 39.0–52.0)
Hemoglobin: 15.2 g/dL (ref 13.0–17.0)
MCH: 30.5 pg (ref 26.0–34.0)
MCHC: 33.7 g/dL (ref 30.0–36.0)
MCV: 90.4 fL (ref 78.0–100.0)
PLATELETS: 168 10*3/uL (ref 150–400)
RBC: 4.99 MIL/uL (ref 4.22–5.81)
RDW: 13 % (ref 11.5–15.5)
WBC: 8.3 10*3/uL (ref 4.0–10.5)

## 2014-10-11 LAB — APTT: APTT: 34 s (ref 24–37)

## 2014-10-11 LAB — PROTIME-INR
INR: 1.06 (ref 0.00–1.49)
PROTHROMBIN TIME: 14 s (ref 11.6–15.2)

## 2014-10-11 LAB — ABO/RH: ABO/RH(D): O POS

## 2014-10-11 NOTE — Progress Notes (Signed)
Final EKG in EPIC done 10/11/14.

## 2014-10-11 NOTE — Progress Notes (Signed)
Received and placed on chart LOV note of 09/28/2014 from [place]- Dr Camillia Herter- clearance for surgery.  No EKG done at this visit.

## 2014-10-11 NOTE — Progress Notes (Signed)
Patient in hospital at [facility] x 2 in 2016.  Faxed signed Medical Release form to [facility] for release of Medical Records to include H and P, d/C summery , ekg and CXR reports.

## 2014-10-11 NOTE — Progress Notes (Signed)
BMP and U/a with micro results done 10/11/14 faxed via EPIC to Dr Alvan Dame.

## 2014-10-11 NOTE — Progress Notes (Signed)
LOV note from DR [facility] 08/08/2014 on chart along with clearance for surgery dated 08/08/2014 on chart.

## 2014-10-11 NOTE — Progress Notes (Addendum)
Called and requested from Dr Camillia Herter from [place] [phone]? Per patient ) per patient and last EKG.

## 2014-10-11 NOTE — Progress Notes (Signed)
Dr Conrad Christie ( anesthesia) made aware patient with history of angioplasty in 1993 and CABG in 2004.  Not followed by cardiologist since 2005- Dr Rollene Fare.  [facility] and also by Dr Camillia Herter- PCP in [place].  LOV note 09/28/2014 for clearance by Dr Camillia Herter on chart. Dr Conrad [place] aware.  Patient exercises regularly at [facility] three days per week- does water aerobics and other exercise per daughter who accompanied him on preop visit. No further orders given.  Will show anesthesia final EKG done 10/11/14 if needed.

## 2014-10-13 NOTE — Progress Notes (Signed)
Refaxed medical release form to [facility] requesting info from hospitalizations of 2016.

## 2014-10-16 NOTE — Progress Notes (Signed)
Never received EKG from [facility].

## 2014-10-16 NOTE — Progress Notes (Signed)
2nd request by fax to [facility] for information to include recent ekg and cxr.

## 2014-10-17 ENCOUNTER — Inpatient Hospital Stay (HOSPITAL_COMMUNITY): Payer: Medicare Other | Admitting: Anesthesiology

## 2014-10-17 ENCOUNTER — Encounter (HOSPITAL_COMMUNITY): Payer: Self-pay | Admitting: *Deleted

## 2014-10-17 ENCOUNTER — Encounter (HOSPITAL_COMMUNITY)
Admission: RE | Disposition: A | Discharge status: Home Health | Payer: Self-pay | Source: Ambulatory Visit | Attending: Orthopedic Surgery

## 2014-10-17 ENCOUNTER — Inpatient Hospital Stay (HOSPITAL_COMMUNITY)
Admission: RE | Admit: 2014-10-17 | Discharge: 2014-10-18 | DRG: 470 | Disposition: A | Discharge status: Home Health | Payer: Medicare Other | Source: Ambulatory Visit | Attending: Orthopedic Surgery | Admitting: Orthopedic Surgery

## 2014-10-17 DIAGNOSIS — Z96659 Presence of unspecified artificial knee joint: Secondary | ICD-10-CM

## 2014-10-17 DIAGNOSIS — Z951 Presence of aortocoronary bypass graft: Secondary | ICD-10-CM | POA: Diagnosis not present

## 2014-10-17 DIAGNOSIS — M25562 Pain in left knee: Secondary | ICD-10-CM | POA: Diagnosis not present

## 2014-10-17 DIAGNOSIS — Z01812 Encounter for preprocedural laboratory examination: Secondary | ICD-10-CM

## 2014-10-17 DIAGNOSIS — Z8679 Personal history of other diseases of the circulatory system: Secondary | ICD-10-CM | POA: Diagnosis not present

## 2014-10-17 DIAGNOSIS — Z96652 Presence of left artificial knee joint: Secondary | ICD-10-CM

## 2014-10-17 DIAGNOSIS — E669 Obesity, unspecified: Secondary | ICD-10-CM | POA: Diagnosis present

## 2014-10-17 DIAGNOSIS — Z6831 Body mass index (BMI) 31.0-31.9, adult: Secondary | ICD-10-CM | POA: Diagnosis not present

## 2014-10-17 DIAGNOSIS — Z87891 Personal history of nicotine dependence: Secondary | ICD-10-CM | POA: Diagnosis not present

## 2014-10-17 DIAGNOSIS — E119 Type 2 diabetes mellitus without complications: Secondary | ICD-10-CM | POA: Diagnosis present

## 2014-10-17 DIAGNOSIS — K219 Gastro-esophageal reflux disease without esophagitis: Secondary | ICD-10-CM | POA: Diagnosis present

## 2014-10-17 DIAGNOSIS — M659 Synovitis and tenosynovitis, unspecified: Secondary | ICD-10-CM | POA: Diagnosis present

## 2014-10-17 DIAGNOSIS — I252 Old myocardial infarction: Secondary | ICD-10-CM | POA: Diagnosis not present

## 2014-10-17 DIAGNOSIS — M1712 Unilateral primary osteoarthritis, left knee: Principal | ICD-10-CM | POA: Diagnosis present

## 2014-10-17 DIAGNOSIS — Z885 Allergy status to narcotic agent status: Secondary | ICD-10-CM

## 2014-10-17 DIAGNOSIS — M179 Osteoarthritis of knee, unspecified: Secondary | ICD-10-CM | POA: Diagnosis not present

## 2014-10-17 HISTORY — PX: TOTAL KNEE ARTHROPLASTY: SHX125

## 2014-10-17 LAB — GLUCOSE, CAPILLARY
GLUCOSE-CAPILLARY: 177 mg/dL — AB (ref 65–99)
GLUCOSE-CAPILLARY: 176 mg/dL — AB (ref 65–99)
GLUCOSE-CAPILLARY: 291 mg/dL — AB (ref 65–99)
GLUCOSE-CAPILLARY: 300 mg/dL — AB (ref 65–99)

## 2014-10-17 LAB — TYPE AND SCREEN
ABO/RH(D): O POS
Antibody Screen: NEGATIVE

## 2014-10-17 SURGERY — ARTHROPLASTY, KNEE, TOTAL
Anesthesia: Spinal | Site: Knee | Laterality: Left

## 2014-10-17 MED ORDER — INSULIN ASPART PROT & ASPART (70-30 MIX) 100 UNIT/ML ~~LOC~~ SUSP
30.0000 [IU] | SUBCUTANEOUS | Status: AC
  Administered 2014-10-17: 30 [IU] via SUBCUTANEOUS
  Filled 2014-10-17: qty 10

## 2014-10-17 MED ORDER — DEXAMETHASONE SODIUM PHOSPHATE 10 MG/ML IJ SOLN
INTRAMUSCULAR | Status: DC | PRN
  Administered 2014-10-17: 10 mg via INTRAVENOUS

## 2014-10-17 MED ORDER — PROPOFOL 10 MG/ML IV BOLUS
INTRAVENOUS | Status: AC
  Filled 2014-10-17: qty 20

## 2014-10-17 MED ORDER — ONDANSETRON HCL 4 MG PO TABS: 4.0000 mg | ORAL_TABLET | Freq: Four times a day (QID) | ORAL | Status: DC | PRN

## 2014-10-17 MED ORDER — VANCOMYCIN HCL IN DEXTROSE 1-5 GM/200ML-% IV SOLN
INTRAVENOUS | Status: AC
  Filled 2014-10-17: qty 200

## 2014-10-17 MED ORDER — VANCOMYCIN HCL IN DEXTROSE 1-5 GM/200ML-% IV SOLN
1000.0000 mg | Freq: Once | INTRAVENOUS | Status: AC
  Administered 2014-10-17: 1000 mg via INTRAVENOUS
  Filled 2014-10-17: qty 200

## 2014-10-17 MED ORDER — PHENYLEPHRINE 40 MCG/ML (10ML) SYRINGE FOR IV PUSH (FOR BLOOD PRESSURE SUPPORT)
PREFILLED_SYRINGE | INTRAVENOUS | Status: AC
  Filled 2014-10-17: qty 10

## 2014-10-17 MED ORDER — PROPOFOL INFUSION 10 MG/ML OPTIME
INTRAVENOUS | Status: DC | PRN
  Administered 2014-10-17: 50 ug/kg/min via INTRAVENOUS

## 2014-10-17 MED ORDER — HYDROMORPHONE HCL 1 MG/ML IJ SOLN
INTRAMUSCULAR | Status: AC
  Filled 2014-10-17: qty 1

## 2014-10-17 MED ORDER — PHENYLEPHRINE HCL 10 MG/ML IJ SOLN
INTRAMUSCULAR | Status: DC | PRN
  Administered 2014-10-17: 40 ug via INTRAVENOUS

## 2014-10-17 MED ORDER — HYDROCODONE-ACETAMINOPHEN 7.5-325 MG PO TABS
1.0000 | ORAL_TABLET | ORAL | Status: DC
  Administered 2014-10-17: 2 via ORAL
  Administered 2014-10-17: 1 via ORAL
  Administered 2014-10-17 – 2014-10-18 (×5): 2 via ORAL
  Filled 2014-10-17 (×3): qty 2
  Filled 2014-10-17: qty 1
  Filled 2014-10-17 (×3): qty 2

## 2014-10-17 MED ORDER — FENTANYL CITRATE (PF) 100 MCG/2ML IJ SOLN
INTRAMUSCULAR | Status: AC
  Filled 2014-10-17: qty 4

## 2014-10-17 MED ORDER — METOPROLOL TARTRATE 25 MG PO TABS
25.0000 mg | ORAL_TABLET | Freq: Two times a day (BID) | ORAL | Status: DC
  Administered 2014-10-18: 25 mg via ORAL
  Filled 2014-10-17 (×3): qty 1

## 2014-10-17 MED ORDER — LACTATED RINGERS IV SOLN: INTRAVENOUS | Status: DC

## 2014-10-17 MED ORDER — TRANEXAMIC ACID 1000 MG/10ML IV SOLN
2000.0000 mg | Freq: Once | INTRAVENOUS | Status: AC
  Filled 2014-10-17: qty 20

## 2014-10-17 MED ORDER — CEFAZOLIN SODIUM-DEXTROSE 2-3 GM-% IV SOLR
2.0000 g | INTRAVENOUS | Status: AC
  Administered 2014-10-17: 2 g via INTRAVENOUS

## 2014-10-17 MED ORDER — INFLUENZA VAC SPLIT QUAD 0.5 ML IM SUSY
0.5000 mL | PREFILLED_SYRINGE | INTRAMUSCULAR | Status: AC
  Administered 2014-10-18: 0.5 mL via INTRAMUSCULAR
  Filled 2014-10-17 (×3): qty 0.5

## 2014-10-17 MED ORDER — MAGNESIUM CITRATE PO SOLN: 1.0000 | Freq: Once | ORAL | Status: DC | PRN

## 2014-10-17 MED ORDER — DOCUSATE SODIUM 100 MG PO CAPS
100.0000 mg | ORAL_CAPSULE | Freq: Two times a day (BID) | ORAL | Status: DC
  Administered 2014-10-17 – 2014-10-18 (×2): 100 mg via ORAL

## 2014-10-17 MED ORDER — INSULIN ASPART 100 UNIT/ML ~~LOC~~ SOLN
0.0000 [IU] | Freq: Three times a day (TID) | SUBCUTANEOUS | Status: DC
Start: 2014-10-17 — End: 2014-10-18
  Administered 2014-10-17: 8 [IU] via SUBCUTANEOUS
  Administered 2014-10-18: 5 [IU] via SUBCUTANEOUS
  Administered 2014-10-18: 2 [IU] via SUBCUTANEOUS

## 2014-10-17 MED ORDER — ONDANSETRON HCL 4 MG/2ML IJ SOLN
INTRAMUSCULAR | Status: AC
  Filled 2014-10-17: qty 2

## 2014-10-17 MED ORDER — POLYETHYLENE GLYCOL 3350 17 G PO PACK
17.0000 g | PACK | Freq: Two times a day (BID) | ORAL | Status: DC
  Administered 2014-10-17 – 2014-10-18 (×2): 17 g via ORAL

## 2014-10-17 MED ORDER — TAMSULOSIN HCL 0.4 MG PO CAPS
0.4000 mg | ORAL_CAPSULE | Freq: Every day | ORAL | Status: DC
  Administered 2014-10-18: 0.4 mg via ORAL
  Filled 2014-10-17: qty 1

## 2014-10-17 MED ORDER — FAMOTIDINE 20 MG PO TABS
20.0000 mg | ORAL_TABLET | Freq: Every day | ORAL | Status: DC
  Administered 2014-10-18: 20 mg via ORAL
  Filled 2014-10-17: qty 1

## 2014-10-17 MED ORDER — SODIUM CHLORIDE 0.9 % IV SOLN
INTRAVENOUS | Status: DC
  Administered 2014-10-17 (×2): via INTRAVENOUS
  Filled 2014-10-17 (×4): qty 1000

## 2014-10-17 MED ORDER — MIDAZOLAM HCL 2 MG/2ML IJ SOLN
INTRAMUSCULAR | Status: AC
  Filled 2014-10-17: qty 4

## 2014-10-17 MED ORDER — MENTHOL 3 MG MT LOZG: 1.0000 | LOZENGE | OROMUCOSAL | Status: DC | PRN

## 2014-10-17 MED ORDER — BUPIVACAINE IN DEXTROSE 0.75-8.25 % IT SOLN
INTRATHECAL | Status: DC | PRN
  Administered 2014-10-17: 1.8 mL via INTRATHECAL

## 2014-10-17 MED ORDER — BISACODYL 10 MG RE SUPP: 10.0000 mg | Freq: Every day | RECTAL | Status: DC | PRN

## 2014-10-17 MED ORDER — HYDROMORPHONE HCL 1 MG/ML IJ SOLN
0.5000 mg | INTRAMUSCULAR | Status: DC | PRN
  Administered 2014-10-17: 0.5 mg via INTRAVENOUS
  Filled 2014-10-17: qty 1

## 2014-10-17 MED ORDER — HYDROMORPHONE HCL 1 MG/ML IJ SOLN
0.2500 mg | INTRAMUSCULAR | Status: DC | PRN
  Administered 2014-10-17 (×2): 0.5 mg via INTRAVENOUS

## 2014-10-17 MED ORDER — METHOCARBAMOL 1000 MG/10ML IJ SOLN
500.0000 mg | Freq: Four times a day (QID) | INTRAVENOUS | Status: DC | PRN
  Administered 2014-10-17: 500 mg via INTRAVENOUS
  Filled 2014-10-17 (×2): qty 5

## 2014-10-17 MED ORDER — METOCLOPRAMIDE HCL 10 MG PO TABS: 5.0000 mg | ORAL_TABLET | Freq: Three times a day (TID) | ORAL | Status: DC | PRN

## 2014-10-17 MED ORDER — KETOROLAC TROMETHAMINE 30 MG/ML IJ SOLN
INTRAMUSCULAR | Status: AC
  Filled 2014-10-17: qty 1

## 2014-10-17 MED ORDER — SODIUM CHLORIDE 0.9 % IJ SOLN
INTRAMUSCULAR | Status: DC | PRN
  Administered 2014-10-17: 30 mL

## 2014-10-17 MED ORDER — TRANEXAMIC ACID 1000 MG/10ML IV SOLN
1000.0000 mg | Freq: Once | INTRAVENOUS | Status: DC
  Filled 2014-10-17: qty 10

## 2014-10-17 MED ORDER — PROPOFOL 10 MG/ML IV BOLUS
INTRAVENOUS | Status: DC | PRN
  Administered 2014-10-17 (×2): 10 mg via INTRAVENOUS

## 2014-10-17 MED ORDER — CEFAZOLIN SODIUM-DEXTROSE 2-3 GM-% IV SOLR
2.0000 g | Freq: Four times a day (QID) | INTRAVENOUS | Status: AC
  Administered 2014-10-17 (×2): 2 g via INTRAVENOUS
  Filled 2014-10-17 (×3): qty 50

## 2014-10-17 MED ORDER — PHENOL 1.4 % MT LIQD: 1.0000 | OROMUCOSAL | Status: DC | PRN

## 2014-10-17 MED ORDER — ATORVASTATIN CALCIUM 40 MG PO TABS
40.0000 mg | ORAL_TABLET | Freq: Every day | ORAL | Status: DC
  Administered 2014-10-17: 40 mg via ORAL
  Filled 2014-10-17 (×2): qty 1

## 2014-10-17 MED ORDER — BUPIVACAINE-EPINEPHRINE (PF) 0.25% -1:200000 IJ SOLN
INTRAMUSCULAR | Status: DC | PRN
  Administered 2014-10-17: 30 mL

## 2014-10-17 MED ORDER — ONDANSETRON HCL 4 MG/2ML IJ SOLN: 4.0000 mg | Freq: Four times a day (QID) | INTRAMUSCULAR | Status: DC | PRN

## 2014-10-17 MED ORDER — METOCLOPRAMIDE HCL 5 MG/ML IJ SOLN: 5.0000 mg | Freq: Three times a day (TID) | INTRAMUSCULAR | Status: DC | PRN

## 2014-10-17 MED ORDER — METHOCARBAMOL 500 MG PO TABS: 500.0000 mg | ORAL_TABLET | Freq: Four times a day (QID) | ORAL | Status: DC | PRN

## 2014-10-17 MED ORDER — BUPIVACAINE-EPINEPHRINE (PF) 0.25% -1:200000 IJ SOLN
INTRAMUSCULAR | Status: AC
  Filled 2014-10-17: qty 30

## 2014-10-17 MED ORDER — CELECOXIB 200 MG PO CAPS
200.0000 mg | ORAL_CAPSULE | Freq: Two times a day (BID) | ORAL | Status: DC
  Administered 2014-10-17: 200 mg via ORAL
  Filled 2014-10-17 (×3): qty 1

## 2014-10-17 MED ORDER — ALUM & MAG HYDROXIDE-SIMETH 200-200-20 MG/5ML PO SUSP: 30.0000 mL | ORAL | Status: DC | PRN

## 2014-10-17 MED ORDER — LACTATED RINGERS IV SOLN
INTRAVENOUS | Status: DC | PRN
  Administered 2014-10-17 (×3): via INTRAVENOUS

## 2014-10-17 MED ORDER — CHLORHEXIDINE GLUCONATE 4 % EX LIQD: 60.0000 mL | Freq: Once | CUTANEOUS | Status: DC

## 2014-10-17 MED ORDER — SODIUM CHLORIDE 0.9 % IJ SOLN
INTRAMUSCULAR | Status: AC
  Filled 2014-10-17: qty 50

## 2014-10-17 MED ORDER — ONDANSETRON HCL 4 MG/2ML IJ SOLN
INTRAMUSCULAR | Status: DC | PRN
  Administered 2014-10-17: 4 mg via INTRAVENOUS

## 2014-10-17 MED ORDER — FERROUS SULFATE 325 (65 FE) MG PO TABS
325.0000 mg | ORAL_TABLET | Freq: Three times a day (TID) | ORAL | Status: DC
  Administered 2014-10-17 – 2014-10-18 (×3): 325 mg via ORAL
  Filled 2014-10-17 (×5): qty 1

## 2014-10-17 MED ORDER — KETOROLAC TROMETHAMINE 30 MG/ML IJ SOLN
INTRAMUSCULAR | Status: DC | PRN
Start: 2014-10-17 — End: 2014-10-17
  Administered 2014-10-17: 30 mg

## 2014-10-17 MED ORDER — ASPIRIN EC 325 MG PO TBEC
325.0000 mg | DELAYED_RELEASE_TABLET | Freq: Two times a day (BID) | ORAL | Status: DC
  Administered 2014-10-18: 325 mg via ORAL
  Filled 2014-10-17 (×3): qty 1

## 2014-10-17 MED ORDER — DEXAMETHASONE SODIUM PHOSPHATE 10 MG/ML IJ SOLN
10.0000 mg | Freq: Once | INTRAMUSCULAR | Status: AC
  Administered 2014-10-18: 10 mg via INTRAVENOUS

## 2014-10-17 MED ORDER — CEFAZOLIN SODIUM-DEXTROSE 2-3 GM-% IV SOLR
INTRAVENOUS | Status: AC
  Filled 2014-10-17: qty 50

## 2014-10-17 MED ORDER — VANCOMYCIN HCL 1000 MG IV SOLR
1000.0000 mg | Freq: Once | INTRAVENOUS | Status: DC
  Filled 2014-10-17: qty 1000

## 2014-10-17 MED ORDER — FENTANYL CITRATE (PF) 100 MCG/2ML IJ SOLN
INTRAMUSCULAR | Status: DC | PRN
  Administered 2014-10-17: 50 ug via INTRAVENOUS
  Administered 2014-10-17 (×2): 25 ug via INTRAVENOUS

## 2014-10-17 MED ORDER — MIDAZOLAM HCL 5 MG/5ML IJ SOLN
INTRAMUSCULAR | Status: DC | PRN
  Administered 2014-10-17: 2 mg via INTRAVENOUS

## 2014-10-17 SURGICAL SUPPLY — 58 items
BAG DECANTER FOR FLEXI CONT (MISCELLANEOUS) IMPLANT
BAG SPEC THK2 15X12 ZIP CLS (MISCELLANEOUS) ×1
BAG ZIPLOCK 12X15 (MISCELLANEOUS) ×3 IMPLANT
BANDAGE ELASTIC 6 VELCRO ST LF (GAUZE/BANDAGES/DRESSINGS) ×3 IMPLANT
BANDAGE ESMARK 6X9 LF (GAUZE/BANDAGES/DRESSINGS) ×1 IMPLANT
BLADE SAW SGTL 13.0X1.19X90.0M (BLADE) ×3 IMPLANT
BNDG CMPR 9X6 STRL LF SNTH (GAUZE/BANDAGES/DRESSINGS) ×1
BNDG ESMARK 6X9 LF (GAUZE/BANDAGES/DRESSINGS) ×3
BONE CEMENT GENTAMICIN (Cement) ×6 IMPLANT
BOWL SMART MIX CTS (DISPOSABLE) ×3 IMPLANT
CAP KNEE TOTAL 3 SIGMA ×2 IMPLANT
CEMENT BONE GENTAMICIN 40 (Cement) IMPLANT
CUFF TOURN SGL QUICK 34 (TOURNIQUET CUFF) ×3
CUFF TRNQT CYL 34X4X40X1 (TOURNIQUET CUFF) ×1 IMPLANT
DECANTER SPIKE VIAL GLASS SM (MISCELLANEOUS) ×3 IMPLANT
DRAPE EXTREMITY T 121X128X90 (DRAPE) ×3 IMPLANT
DRAPE POUCH INSTRU U-SHP 10X18 (DRAPES) ×3 IMPLANT
DRAPE U-SHAPE 47X51 STRL (DRAPES) ×3 IMPLANT
DRSG AQUACEL AG ADV 3.5X10 (GAUZE/BANDAGES/DRESSINGS) ×3 IMPLANT
DURAPREP 26ML APPLICATOR (WOUND CARE) ×6 IMPLANT
ELECT REM PT RETURN 9FT ADLT (ELECTROSURGICAL) ×3
ELECTRODE REM PT RTRN 9FT ADLT (ELECTROSURGICAL) ×1 IMPLANT
FACESHIELD WRAPAROUND (MASK) ×15 IMPLANT
FACESHIELD WRAPAROUND OR TEAM (MASK) ×5 IMPLANT
GLOVE BIOGEL M 7.0 STRL (GLOVE) ×2 IMPLANT
GLOVE BIOGEL PI IND STRL 7.5 (GLOVE) ×1 IMPLANT
GLOVE BIOGEL PI IND STRL 8.5 (GLOVE) ×1 IMPLANT
GLOVE BIOGEL PI INDICATOR 7.5 (GLOVE) ×4
GLOVE BIOGEL PI INDICATOR 8.5 (GLOVE) ×2
GLOVE ECLIPSE 8.0 STRL XLNG CF (GLOVE) ×1 IMPLANT
GLOVE ORTHO TXT STRL SZ7.5 (GLOVE) ×4 IMPLANT
GOWN SPEC L3 XXLG W/TWL (GOWN DISPOSABLE) IMPLANT
GOWN STRL REUS W/TWL LRG LVL3 (GOWN DISPOSABLE) ×3 IMPLANT
HANDPIECE INTERPULSE COAX TIP (DISPOSABLE) ×3
KIT BASIN OR (CUSTOM PROCEDURE TRAY) ×3 IMPLANT
LIQUID BAND (GAUZE/BANDAGES/DRESSINGS) ×3 IMPLANT
MANIFOLD NEPTUNE II (INSTRUMENTS) ×3 IMPLANT
NDL SAFETY ECLIPSE 18X1.5 (NEEDLE) ×1 IMPLANT
NEEDLE HYPO 18GX1.5 SHARP (NEEDLE) ×6
PACK TOTAL JOINT (CUSTOM PROCEDURE TRAY) ×3 IMPLANT
PEN SKIN MARKING BROAD (MISCELLANEOUS) ×3 IMPLANT
POSITIONER SURGICAL ARM (MISCELLANEOUS) ×3 IMPLANT
SET HNDPC FAN SPRY TIP SCT (DISPOSABLE) ×1 IMPLANT
SET PAD KNEE POSITIONER (MISCELLANEOUS) ×3 IMPLANT
SUCTION FRAZIER 12FR DISP (SUCTIONS) ×3 IMPLANT
SUT MNCRL AB 4-0 PS2 18 (SUTURE) ×3 IMPLANT
SUT VIC AB 1 CT1 36 (SUTURE) ×3 IMPLANT
SUT VIC AB 2-0 CT1 27 (SUTURE) ×9
SUT VIC AB 2-0 CT1 TAPERPNT 27 (SUTURE) ×3 IMPLANT
SUT VLOC 180 0 24IN GS25 (SUTURE) ×3 IMPLANT
SYR 50ML LL SCALE MARK (SYRINGE) ×5 IMPLANT
TOWEL OR 17X26 10 PK STRL BLUE (TOWEL DISPOSABLE) ×3 IMPLANT
TOWEL OR NON WOVEN STRL DISP B (DISPOSABLE) IMPLANT
TRAY FOLEY W/METER SILVER 14FR (SET/KITS/TRAYS/PACK) ×1 IMPLANT
TRAY FOLEY W/METER SILVER 16FR (SET/KITS/TRAYS/PACK) ×3 IMPLANT
WATER STERILE IRR 1500ML POUR (IV SOLUTION) ×3 IMPLANT
WRAP KNEE MAXI GEL POST OP (GAUZE/BANDAGES/DRESSINGS) ×3 IMPLANT
YANKAUER SUCT BULB TIP 10FT TU (MISCELLANEOUS) ×3 IMPLANT

## 2014-10-17 NOTE — Anesthesia Postprocedure Evaluation (Signed)
  Anesthesia Post-op Note  Patient: Jeffrey Kelly  Procedure(s) Performed: Procedure(s) (LRB): LEFT TOTAL KNEE ARTHROPLASTY (Left)  Patient Location: PACU  Anesthesia Type: Spinal  Level of Consciousness: awake and alert   Airway and Oxygen Therapy: Patient Spontanous Breathing  Post-op Pain: mild  Post-op Assessment: Post-op Vital signs reviewed, Patient's Cardiovascular Status Stable, Respiratory Function Stable, Patent Airway and No signs of Nausea or vomiting. Purposeful movement of both legs in PACU.  Last Vitals:  Filed Vitals:   10/17/14 1540  BP: 141/58  Pulse: 64  Temp: 36.7 C  Resp: 16    Post-op Vital Signs: stable   Complications: No apparent anesthesia complications

## 2014-10-17 NOTE — Op Note (Signed)
NAME:  Jeffrey Kelly                      MEDICAL RECORD NO.:  [id number]                             FACILITY:  [facility]      PHYSICIAN:  Pietro Cassis. Alvan Dame, M.D.  DATE OF BIRTH:  11-17-1934      DATE OF PROCEDURE:  10/17/2014                                     OPERATIVE REPORT         PREOPERATIVE DIAGNOSIS:  Left knee osteoarthritis.      POSTOPERATIVE DIAGNOSIS:  Left knee osteoarthritis.      FINDINGS:  The patient was noted to have complete loss of cartilage and   bone-on-bone arthritis with associated osteophytes in the medial and patellofemoral compartments of   the knee with a significant synovitis and associated effusion.      PROCEDURE:  Left total knee replacement.      COMPONENTS USED:  [employer] rotating platform posterior stabilized knee   system, a size 3 femur, 3 tibia, 10 mm PS insert, and 38 patellar   button.      SURGEON:  Pietro Cassis. Alvan Dame, M.D.      ASSISTANT:  Nehemiah Massed, PA-C.      ANESTHESIA:  Spinal.      SPECIMENS:  None.      COMPLICATION:  None.      DRAINS:  None.  EBL: <50cc      TOURNIQUET TIME:   Total Tourniquet Time Documented: Thigh (Right) - 32 minutes Total: Thigh (Right) - 32 minutes  .      The patient was stable to the recovery room.      INDICATION FOR PROCEDURE:  [name] is a 80 y.o. male patient of   mine.  The patient had been seen, evaluated, and treated conservatively in the   office with medication, activity modification, and injections.  The patient had   radiographic changes of bone-on-bone arthritis with endplate sclerosis and osteophytes noted.      The patient failed conservative measures including medication, injections, and activity modification, and at this point was ready for more definitive measures.   Based on the radiographic changes and failed conservative measures, the patient   decided to proceed with total knee replacement.  Risks of infection,   DVT, component failure, need for revision  surgery, postop course, and   expectations were all   discussed and reviewed.  Consent was obtained for benefit of pain   relief.      PROCEDURE IN DETAIL:  The patient was brought to the operative theater.   Once adequate anesthesia, preoperative antibiotics, 1 gm of Vancomycin, 2 gm of Ancef, and 10 mg of Decadron (TXA administered topically) administered, the patient was positioned supine with the left thigh tourniquet placed.  The  left lower extremity was prepped and draped in sterile fashion.  A time-   out was performed identifying the patient, planned procedure, and   extremity.      The left lower extremity was placed in the [facility] [address] leg holder.  The leg was   exsanguinated, tourniquet elevated to 250 mmHg.  A midline incision was   made followed  by median parapatellar arthrotomy.  Following initial   exposure, attention was first directed to the patella.  Precut   measurement was noted to be 23 mm.  I resected down to 14 mm and used a   38 patellar button to restore patellar height as well as cover the cut   surface.      The lug holes were drilled and a metal shim was placed to protect the   patella from retractors and saw blades.      At this point, attention was now directed to the femur.  The femoral   canal was opened with a drill, irrigated to try to prevent fat emboli.  An   intramedullary rod was passed at 3 degrees valgus, 10 mm of bone was   resected off the distal femur.  Following this resection, the tibia was   subluxated anteriorly.  Using the extramedullary guide, 2 mm of bone was resected off   the proximal medial tibia.  We confirmed the gap would be   stable medially and laterally with a 10 mm insert as well as confirmed   the cut was perpendicular in the coronal plane, checking with an alignment rod.      Once this was done, I sized the femur to be a size 3 in the anterior-   posterior dimension, chose a standard component based on medial and   lateral  dimension.  The size 3 rotation block was then pinned in   position anterior referenced using the C-clamp to set rotation.  The   anterior, posterior, and  chamfer cuts were made without difficulty nor   notching making certain that I was along the anterior cortex to help   with flexion gap stability.      The final box cut was made off the lateral aspect of distal femur.      At this point, the tibia was sized to be a size 3, the size 3 tray was   then pinned in position through the medial third of the tubercle,   drilled, and keel punched.  Trial reduction was now carried with a 3 femur,  3 tibia, a size 10 mm insert, and the 38 patella botton.  The knee was brought to   extension, full extension with good flexion stability with the patella   tracking through the trochlea without application of pressure.  Given   all these findings, the trial components removed.  Final components were   opened and cement was mixed.  The knee was irrigated with normal saline   solution and pulse lavage.  The synovial lining was   then injected with 30cc of 0.25% Marcaine with epinephrine and 1 cc of Toradol plus 30cc of NS for a   total of 61 cc.      The knee was irrigated.  Final implants were then cemented onto clean and   dried cut surfaces of bone with the knee brought to extension with a size 10 mm trial insert.      Once the cement had fully cured, the excess cement was removed   throughout the knee.  I confirmed I was satisfied with the range of   motion and stability, and the final 10 mm PS insert was chosen.  It was   placed into the knee.      The tourniquet had been let down at 32 minutes.  No significant   hemostasis required.  The   extensor mechanism was then  reapproximated using #1 Vicryl and # 0 V-lock sutures with the knee   in flexion.  The   remaining wound was closed with 2-0 Vicryl and running 4-0 Monocryl.   The knee was cleaned, dried, dressed sterilely using Dermabond and    Aquacel dressing.  The patient was then   brought to recovery room in stable condition, tolerating the procedure   well.   Please note that Physician Assistant, Nehemiah Massed, PA-C, was present for the entirety of the case, and was utilized for pre-operative positioning, peri-operative retractor management, general facilitation of the procedure.  He was also utilized for primary wound closure at the end of the case.              Pietro Cassis Alvan Dame, M.D.    10/17/2014 10:15 AM

## 2014-10-17 NOTE — Interval H&P Note (Signed)
History and Physical Interval Note:  10/17/2014 6:43 AM  Jeffrey Kelly  has presented today for surgery, with the diagnosis of left knee osteoarthritis  The various methods of treatment have been discussed with the patient and family. After consideration of risks, benefits and other options for treatment, the patient has consented to  Procedure(s): LEFT TOTAL KNEE ARTHROPLASTY (Left) as a surgical intervention .  The patient's history has been reviewed, patient examined, no change in status, stable for surgery.  I have reviewed the patient's chart and labs.  Questions were answered to the patient's satisfaction.     Mauri Pole

## 2014-10-17 NOTE — Progress Notes (Signed)
Utilization review completed.  

## 2014-10-17 NOTE — Progress Notes (Signed)
MEDICATION RELATED CONSULT NOTE -   Pharmacy asked to evaluate diabetic regimen to see if getting proper treatment in the hospital. Patient normally takes Novolin 70/30 50 units in AM, 60 units in PM. Patient received 1/2 dose (30 units) on 9/19 PM and no other insulin since then. Patient received dose of dexamethasone intra-operatively. Blood sugar at suppertime today elevated at 291, likely due to steroids and missed insulin dose this AM. Moderate SSI with meals ordered by MD. Will continue this SSI and give additional 1/2 dose of Novolog Mix 70/30 (30 units) SQ x 1 now. Re-assess in AM-can likely resume full home dose of 70/30 tomorrow as diet advanced.    Lindell Spar, PharmD, BCPS Pager: [phone] 10/17/2014 5:30 PM

## 2014-10-17 NOTE — Anesthesia Preprocedure Evaluation (Addendum)
Anesthesia Evaluation  Patient identified by MRN, date of birth, ID band Patient awake    Reviewed: Allergy & Precautions, NPO status , Patient's Chart, lab work & pertinent test results  Airway Mallampati: II  TM Distance: >3 FB Neck ROM: Full    Dental no notable dental hx.    Pulmonary shortness of breath, former smoker,    Pulmonary exam normal breath sounds clear to auscultation       Cardiovascular Exercise Tolerance: Good + Past MI  Normal cardiovascular exam+ dysrhythmias  Rhythm:Regular Rate:Normal  S/P CABG 2004   Neuro/Psych negative neurological ROS  negative psych ROS   GI/Hepatic Neg liver ROS, GERD  Medicated,  Endo/Other  diabetes, Type 1, Insulin Dependent  Renal/GU Cr 1.52  K 5.3  Cr in 2008 was 1.24  negative genitourinary   Musculoskeletal  (+) Arthritis ,   Abdominal (+) + obese,   Peds negative pediatric ROS (+)  Hematology negative hematology ROS (+)   Anesthesia Other Findings   Reproductive/Obstetrics negative OB ROS                          Anesthesia Physical Anesthesia Plan  ASA: III  Anesthesia Plan: Spinal   Post-op Pain Management:    Induction: Intravenous  Airway Management Planned:   Additional Equipment:   Intra-op Plan:   Post-operative Plan: Extubation in OR  Informed Consent: I have reviewed the patients History and Physical, chart, labs and discussed the procedure including the risks, benefits and alternatives for the proposed anesthesia with the patient or authorized representative who has indicated his/her understanding and acceptance.   Dental advisory given  Plan Discussed with: CRNA  Anesthesia Plan Comments: (Discussed risks and benefits of and differences between spinal and general. Discussed risks of spinal including headache, backache, failure, bleeding and hematoma, infection, and nerve damage and paralysis. Patient consents  to spinal. Questions answered. Coagulation studies and platelet count acceptable.   Discussed Creatinine with Dr. Alvan Dame.)       Anesthesia Quick Evaluation

## 2014-10-17 NOTE — Evaluation (Signed)
Physical Therapy Evaluation Patient Details Name: Jeffrey Kelly MRN: [id number] DOB: May 21, 1934 Today's Date: 10/17/2014   History of Present Illness  L TKA  Clinical Impression  Patient ambulated 10' today . Patient will benefit from PT to address problems listed in note below.  In order to DC to home.    Follow Up Recommendations Home health PT;Supervision/Assistance - 24 hour    Equipment Recommendations  Rolling walker with 5" wheels (You[employer])    Recommendations for Other Services       Precautions / Restrictions Precautions Precautions: Knee      Mobility  Bed Mobility Overal bed mobility: Needs Assistance Bed Mobility: Supine to Sit     Supine to sit: Min assist     General bed mobility comments: support  L LEG  Transfers Overall transfer level: Needs assistance Equipment used: Rolling walker (2 wheeled) Transfers: Sit to/from Stand Sit to Stand: Min assist         General transfer comment: cues for hand and LLE position  Ambulation/Gait Ambulation/Gait assistance: Min assist Ambulation Distance (Feet): 10 Feet   Gait Pattern/deviations: Antalgic;Step-to pattern;Decreased step length - left;Decreased stance time - left     General Gait Details: cues for sequence and posture  Stairs            Wheelchair Mobility    Modified Rankin (Stroke Patients Only)       Balance                                             Pertinent Vitals/Pain Pain Assessment: 0-10 Pain Score: 7  Pain Location: L knee Pain Descriptors / Indicators: Aching;Burning Pain Intervention(s): Limited activity within patient's tolerance;Monitored during session;RN gave pain meds during session;Ice applied    Home Living Family/patient expects to be discharged to:: Private residence Living Arrangements: Alone;Spouse/significant other Available Help at Discharge: Family Type of Home: House Home Access: [place]: One  [place]: None Additional Comments: will stay at his friends home    Prior Function Level of Independence: Independent               Hand Dominance        Extremity/Trunk Assessment               Lower Extremity Assessment: LLE deficits/detail   LLE Deficits / Details: able to railse leg a few inches  Cervical / Trunk Assessment: Normal  Communication   Communication: HOH  Cognition Arousal/Alertness: Awake/alert Behavior During Therapy: WFL for tasks assessed/performed Overall Cognitive Status: Within Functional Limits for tasks assessed                      General Comments      Exercises Total Joint Exercises Long Arc Quad: AROM;Left;5 reps;Seated Knee Flexion: AROM;Left;5 reps;Seated      Assessment/Plan    PT Assessment Patient needs continued PT services  PT Diagnosis Difficulty walking;Acute pain   PT Problem List Decreased strength;Decreased range of motion;Decreased activity tolerance;Decreased mobility;Decreased knowledge of precautions;Decreased safety awareness;Decreased knowledge of use of DME;Pain  PT Treatment Interventions DME instruction;Gait training;Functional mobility training;Therapeutic activities;Therapeutic exercise;Patient/family education   PT Goals (Current goals can be found in the Care Plan section) Acute Rehab PT Goals Patient Stated Goal: to walk and go home PT Goal Formulation: With patient/family Time For Goal Achievement:  10/19/14 Potential to Achieve Goals: Good    Frequency 7X/week   Barriers to discharge        Co-evaluation               End of Session   Activity Tolerance: Patient tolerated treatment well Patient left: in chair;with call bell/phone within reach Nurse Communication: Mobility status         Time: 1647-1710 PT Time Calculation (min) (ACUTE ONLY): 23 min   Charges:   PT Evaluation $Initial PT Evaluation Tier I: 1 Procedure PT Treatments $Gait Training:  8-22 mins   PT G Codes:        Jeffrey Kelly 10/17/2014, 5:29 PM

## 2014-10-17 NOTE — Anesthesia Procedure Notes (Signed)
Spinal Patient location during procedure: OR Start time: 10/17/2014 8:45 AM End time: 10/17/2014 8:50 AM Staffing Anesthesiologist: Franne Grip Resident/CRNA: Darlys Gales R Performed by: resident/CRNA  Preanesthetic Checklist Completed: patient identified, site marked, surgical consent, pre-op evaluation, timeout performed, IV checked, risks and benefits discussed and monitors and equipment checked Spinal Block Patient position: sitting Prep: Betadine Patient monitoring: heart rate, continuous pulse ox and blood pressure Location: L3-4 Injection technique: single-shot Needle Needle type: Spinocan  Needle gauge: 22 G Needle length: 9 cm Needle insertion depth: 8 cm Assessment Sensory level: T6 Additional Notes Expiration date of kit checked (03/26/2016) and confirmed. Lot [id number]. Patient tolerated procedure well, without complications.

## 2014-10-17 NOTE — Transfer of Care (Signed)
Immediate Anesthesia Transfer of Care Note  Patient: Jeffrey Kelly  Procedure(s) Performed: Procedure(s): LEFT TOTAL KNEE ARTHROPLASTY (Left)  Patient Location: PACU  Anesthesia Type:Spinal  Level of Consciousness:  sedated, patient cooperative and responds to stimulation  Airway & Oxygen Therapy:Patient Spontanous Breathing and Patient connected to face mask oxgen  Post-op Assessment:  Report given to PACU RN and Post -op Vital signs reviewed and stable  Post vital signs:  Reviewed and stable  Last Vitals:  Filed Vitals:   10/17/14 0603  BP: 132/63  Pulse: 64  Temp: 36.6 C  Resp: 18    Complications: No apparent anesthesia complications

## 2014-10-18 DIAGNOSIS — M1712 Unilateral primary osteoarthritis, left knee: Secondary | ICD-10-CM | POA: Diagnosis not present

## 2014-10-18 LAB — BASIC METABOLIC PANEL
ANION GAP: 7 (ref 5–15)
BUN: 33 mg/dL — ABNORMAL HIGH (ref 6–20)
CALCIUM: 8.5 mg/dL — AB (ref 8.9–10.3)
CO2: 22 mmol/L (ref 22–32)
CREATININE: 1.78 mg/dL — AB (ref 0.61–1.24)
Chloride: 104 mmol/L (ref 101–111)
GFR, EST AFRICAN AMERICAN: 41 mL/min — AB (ref >60)
GFR, EST NON AFRICAN AMERICAN: 35 mL/min — AB (ref >60)
Glucose, Bld: 239 mg/dL — ABNORMAL HIGH (ref 65–99)
Potassium: 5.3 mmol/L — ABNORMAL HIGH (ref 3.5–5.1)
SODIUM: 133 mmol/L — AB (ref 135–145)

## 2014-10-18 LAB — CBC
HCT: 35.8 % — ABNORMAL LOW (ref 39.0–52.0)
Hemoglobin: 12.2 g/dL — ABNORMAL LOW (ref 13.0–17.0)
MCH: 30.3 pg (ref 26.0–34.0)
MCHC: 34.1 g/dL (ref 30.0–36.0)
MCV: 88.8 fL (ref 78.0–100.0)
Platelets: 144 10*3/uL — ABNORMAL LOW (ref 150–400)
RBC: 4.03 MIL/uL — ABNORMAL LOW (ref 4.22–5.81)
RDW: 12.6 % (ref 11.5–15.5)
WBC: 14.2 10*3/uL — AB (ref 4.0–10.5)

## 2014-10-18 LAB — GLUCOSE, CAPILLARY
GLUCOSE-CAPILLARY: 226 mg/dL — AB (ref 65–99)
GLUCOSE-CAPILLARY: 147 mg/dL — AB (ref 65–99)

## 2014-10-18 MED ORDER — HYDROCODONE-ACETAMINOPHEN 7.5-325 MG PO TABS: 1.0000 | ORAL_TABLET | ORAL | Status: DC | PRN

## 2014-10-18 MED ORDER — FERROUS SULFATE 325 (65 FE) MG PO TABS: 325.0000 mg | ORAL_TABLET | Freq: Three times a day (TID) | ORAL | Status: DC

## 2014-10-18 MED ORDER — DOCUSATE SODIUM 100 MG PO CAPS: 100.0000 mg | ORAL_CAPSULE | Freq: Two times a day (BID) | ORAL | Status: DC

## 2014-10-18 MED ORDER — METHOCARBAMOL 500 MG PO TABS: 500.0000 mg | ORAL_TABLET | Freq: Four times a day (QID) | ORAL | Status: DC | PRN

## 2014-10-18 MED ORDER — INSULIN ASPART PROT & ASPART (70-30 MIX) 100 UNIT/ML ~~LOC~~ SUSP
50.0000 [IU] | Freq: Every day | SUBCUTANEOUS | Status: DC
  Administered 2014-10-18: 50 [IU] via SUBCUTANEOUS
  Filled 2014-10-18: qty 10

## 2014-10-18 MED ORDER — ASPIRIN 325 MG PO TBEC: 325.0000 mg | DELAYED_RELEASE_TABLET | Freq: Two times a day (BID) | ORAL | Status: AC

## 2014-10-18 MED ORDER — INSULIN ASPART PROT & ASPART (70-30 MIX) 100 UNIT/ML ~~LOC~~ SUSP
60.0000 [IU] | Freq: Every day | SUBCUTANEOUS | Status: DC
  Filled 2014-10-18: qty 10

## 2014-10-18 NOTE — Progress Notes (Signed)
     Subjective: 1 Day Post-Op Procedure(s) (LRB): LEFT TOTAL KNEE ARTHROPLASTY (Left)   Patient reports pain as mild, pain controlled. No events throughout the night. Dr. Alvan Dame discussed with the patient regarding his increased creat, the patient does have an elevate value from 1 week ago and is normal for the patient.  He is already moving the leg really well while in bed. Ready to be discharged home.  Objective:   VITALS:   Filed Vitals:   10/18/14 0545  BP: 114/51  Pulse: 58  Temp: 97.7 F (36.5 C)  Resp: 16    Dorsiflexion/Plantar flexion intact Incision: dressing C/D/I No cellulitis present Compartment soft  LABS  Recent Labs  10/18/14 0438  HGB 12.2*  HCT 35.8*  WBC 14.2*  PLT 144*     Recent Labs  10/18/14 0438  NA 133*  K 5.3*  BUN 33*  CREATININE 1.78*  GLUCOSE 239*     Assessment/Plan: 1 Day Post-Op Procedure(s) (LRB): LEFT TOTAL KNEE ARTHROPLASTY (Left) Foley cath d/c'ed Advance diet Up with therapy D/C IV fluids Discharge home with home health  Follow up in 2 weeks at [facility]. Follow up with OLIN,MATTHEW D in 2 weeks.  Contact information:  [facility] [address] [place] [phone]    Obese (BMI 30-39.9) Estimated body mass index is 31.38 kg/(m^2) as calculated from the following:   Height as of this encounter: 5' 3.5" (1.613 m).   Weight as of this encounter: 81.647 kg (180 lb). Patient also counseled that weight may inhibit the healing process Patient counseled that losing weight will help with future health issues        West Pugh. Babish   PAC  10/18/2014, 8:17 AM

## 2014-10-18 NOTE — Progress Notes (Addendum)
MEDICATION RELATED CONSULT NOTE  Blood sugars remain elevated this AM at 226. Ordered one more dose of Dexamethasone 10 mg IV x 1 today. Discussed with Adrian Prince, PA-will resume home dose of 70/30 insulin (Novolog Mix 70/30 substituted for Novolin 70/30 while inpatient): 50 units in AM, 60 units in PM. First dose given this morning. Will also continue moderate scale SSI while inpatient.   Lindell Spar, PharmD, BCPS Pager: [phone] 10/18/2014 9:31 AM

## 2014-10-18 NOTE — Discharge Instructions (Signed)

## 2014-10-18 NOTE — Evaluation (Signed)
Occupational Therapy Evaluation Patient Details Name: Jeffrey Kelly MRN: [id number] DOB: [birth year] Today's Date: 10/18/2014    History of Present Illness L TKA   Clinical Impression   Patient admitted with above. Patient independent PTA. Patient currently requires supervision for functional mobility & transfers and min assist for LB ADLs secondary to body habitus and recent L TKA. Educated pt on AE (hip kit) with handout administered. D/C from acute OT services and no additional follow-up OT needs at this time. All appropriate education provided to patient and family Inez Catalina). Please re-order OT if needed.      Follow Up Recommendations  No OT follow up;Supervision - Intermittent    Equipment Recommendations  Other (comment) (AE)    Recommendations for Other Services   None at this time  Precautions / Restrictions Precautions Precautions: Knee;Fall Restrictions Weight Bearing Restrictions: Yes LLE Weight Bearing: Weight bearing as tolerated    Mobility Bed Mobility General bed mobility comments: Pt found seated in recliner upon OT entering/exiting room  Transfers Overall transfer level: Needs assistance Equipment used: Rolling walker (2 wheeled) Transfers: Sit to/from Stand Sit to Stand: Supervision General transfer comment: Distant supervision, no cues needed    Balance Overall balance assessment: Needs assistance Sitting-balance support: No upper extremity supported;Feet supported Sitting balance-Leahy Scale: Good     Standing balance support: Bilateral upper extremity supported;During functional activity Standing balance-Leahy Scale: Good Standing balance comment: using RW    ADL Overall ADL's : Needs assistance/impaired General ADL Comments: Pt unable to reach BLEs for LB ADLs due to body habitus and recent surgery. Educated pt on use of AE to increase independence with this; administerred handout. Inez Catalina, pt's significant other stated she has the equipment at home  and knows how to use it. Pt ambulated <>BR for toilet transfer (elevated toilet seat) at distant supervision level. Pt stated he has a tub transfer bench and will use a tub/shower post acute d/c.     Pertinent Vitals/Pain Pain Assessment: Faces Pain Score: 2  Faces Pain Scale: Hurts a little bit Pain Location: left knee Pain Descriptors / Indicators: Discomfort Pain Intervention(s): Monitored during session   Extremity/Trunk Assessment Upper Extremity Assessment Upper Extremity Assessment: Overall WFL for tasks assessed   Lower Extremity Assessment Lower Extremity Assessment: Defer to PT evaluation   Cervical / Trunk Assessment Cervical / Trunk Assessment: Normal   Communication Communication Communication: HOH   Cognition Arousal/Alertness: Awake/alert Behavior During Therapy: WFL for tasks assessed/performed Overall Cognitive Status: Within Functional Limits for tasks assessed              Home Living Family/patient expects to be discharged to:: Private residence Living Arrangements: Alone;Spouse/significant other Available Help at Discharge: Family Type of Home: House Home Access: Ramped entrance     Home Layout: One level     Bathroom Shower/Tub: Corporate investment banker: Handicapped height     Home Equipment: Tub bench   Additional Comments: will stay at his friends home      Prior Functioning/Environment Level of Independence: Independent      OT Diagnosis: Generalized weakness;Acute pain   OT Problem List:  n/a, no acute OT needs identified    OT Treatment/Interventions:   n/a, no acute OT needs identified    OT Goals(Current goals can be found in the care plan section) Acute Rehab OT Goals Patient Stated Goal: go home today OT Goal Formulation: All assessment and education complete, DC therapy  OT Frequency:   n/a, no acute OT needs  identified    Barriers to D/C:  None known at this time    End of Session Equipment Utilized  During Treatment: Rolling walker  Activity Tolerance: Patient tolerated treatment well Patient left: in chair;with call bell/phone within reach;with family/visitor present   Time: 1200-1214 OT Time Calculation (min): 14 min Charges:  OT General Charges $OT Visit: 1 Procedure OT Evaluation $Initial OT Evaluation Tier I: 1 Procedure  CLAY,PATRICIA , MS, OTR/L, CLT Pager: [phone]  10/18/2014, 12:43 PM

## 2014-10-18 NOTE — Progress Notes (Signed)
Physical Therapy Treatment Patient Details Name: Jeffrey Kelly MRN: [id number] DOB: Jun 23, 1934 Today's Date: 10/18/2014    History of Present Illness L TKA    PT Comments    Patient is progressing well. May be able  To DC today.  Awaiting clearance of labs/renal fx.  Follow Up Recommendations  Home health PT;Supervision/Assistance - 24 hour     Equipment Recommendations  Rolling walker with 5" wheels    Recommendations for Other Services       Precautions / Restrictions Precautions Precautions: Knee;Fall Restrictions Weight Bearing Restrictions: No    Mobility  Bed Mobility   Bed Mobility: Sit to Supine       Sit to supine: Supervision   General bed mobility comments: manages L leg w/out assist.  Transfers   Equipment used: Rolling walker (2 wheeled) Transfers: Sit to/from Stand           General transfer comment: cues for hand and LLE position  Ambulation/Gait Ambulation/Gait assistance: Min guard Ambulation Distance (Feet): 200 Feet Assistive device: Rolling walker (2 wheeled) Gait Pattern/deviations: Step-through pattern;Step-to pattern;Antalgic     General Gait Details: cues for sequence and posture   Stairs            Wheelchair Mobility    Modified Rankin (Stroke Patients Only)       Balance                                    Cognition Arousal/Alertness: Awake/alert                          Exercises Total Joint Exercises Ankle Circles/Pumps: AROM;Both;10 reps;Supine Quad Sets: AROM;Both;10 reps;Supine Gluteal Sets: Supine Towel Squeeze: AROM;Both;10 reps;Supine Short Arc Quad: AROM;Right Heel Slides: AROM;Right;10 reps;Supine Hip ABduction/ADduction: AROM;Right;10 reps;Supine Straight Leg Raises: AROM;Right;10 reps;Supine Long Arc Quad: 10 reps;AROM;Right Knee Flexion: AROM;Right;Seated Goniometric ROM: 5-90  L knee    General Comments        Pertinent Vitals/Pain Pain Score: 2  Pain  Location: L knee Pain Descriptors / Indicators: Aching;Tightness Pain Intervention(s): Monitored during session;Ice applied;Repositioned    Home Living                      Prior Function            PT Goals (current goals can now be found in the care plan section) Progress towards PT goals: Progressing toward goals    Frequency  7X/week    PT Plan Current plan remains appropriate    Co-evaluation             End of Session   Activity Tolerance: Patient tolerated treatment well Patient left: in bed;with call bell/phone within reach;with family/visitor present     Time: 2671-2458 PT Time Calculation (min) (ACUTE ONLY): 45 min  Charges:  $Gait Training: 8-22 mins $Therapeutic Exercise: 8-22 mins $Self Care/Home Management: 8-22                    G Codes:      Claretha Cooper 10/18/2014, 10:40 AM

## 2014-10-18 NOTE — Progress Notes (Signed)
RN reviewed discharge instructions with patient and family. All questions answered.   Paperwork and prescriptions given.   NT rolled patient down in wheelchair to family care.

## 2014-10-18 NOTE — Care Management Note (Signed)
Case Management Note  Patient Details  Name: Jeffrey Kelly MRN: [id number] Date of Birth: 1934-06-29  Subjective/Objective:                   LEFT TOTAL KNEE ARTHROPLASTY (Left) Action/Plan: Discharge planning  Expected Discharge Date:  10/18/14               Expected Discharge Plan:  [place]  In-House Referral:     Discharge planning Services  CM Consult  Post Acute Care Choice:  Home Health Choice offered to:  Patient  DME Arranged:  [facility] DME Agency:  [place] Arranged:  PT [place] Agency:  [place]  Status of Service:  Completed, signed off  Medicare Important Message Given:    Date Medicare IM Given:    Medicare IM give by:    Date Additional Medicare IM Given:    Additional Medicare Important Message give by:     If discussed at [place] of Stay Meetings, dates discussed:    Additional Comments: CM met with pt in room to offer choice of home health agency.  Pt chooses [facility] to render HHPT.  Pt will be at friend's address [address] [place], [place] [address] and can be reached on his cell [phone] or [phone].  Referral called to [facility] rep, Tim with appropriate address and contact information.  CM called [facility] DME rep, Lecretia to please deliver the youth rolling walker to room so pt can discharge.  No other CM needs were communicated. Dellie Catholic, RN 10/18/2014, 10:16 AM

## 2014-10-18 NOTE — Progress Notes (Signed)
Physical Therapy Treatment Patient Details Name: Jeffrey Kelly MRN: [id number] DOB: 06-Oct-1934 Today's Date: 10/18/2014    History of Present Illness L TKA    PT Comments    Patient is tolerating activity very well. Ready for DC.  Follow Up Recommendations  Home health PT;Supervision/Assistance - 24 hour     Equipment Recommendations  Rolling walker with 5" wheels    Recommendations for Other Services       Precautions / Restrictions Precautions Precautions: Knee;Fall Restrictions Weight Bearing Restrictions: Yes LLE Weight Bearing: Weight bearing as tolerated    Mobility  Bed Mobility   Bed Mobility: Sit to Supine       Sit to supine: Supervision   General bed mobility comments: Pt found seated in recliner upon OT entering/exiting room  Transfers Overall transfer level: Needs assistance Equipment used: Rolling walker (2 wheeled) Transfers: Sit to/from Stand Sit to Stand: Supervision         General transfer comment: Distant supervision, no cues needed  Ambulation/Gait Ambulation/Gait assistance: Supervision Ambulation Distance (Feet): 200 Feet Assistive device: Rolling walker (2 wheeled) Gait Pattern/deviations: Step-through pattern     General Gait Details: cues for sequence and posture   Stairs            Wheelchair Mobility    Modified Rankin (Stroke Patients Only)       Balance Overall balance assessment: Needs assistance Sitting-balance support: No upper extremity supported;Feet supported Sitting balance-Leahy Scale: Good     Standing balance support: Bilateral upper extremity supported;During functional activity Standing balance-Leahy Scale: Good Standing balance comment: using RW                    Cognition Arousal/Alertness: Awake/alert Behavior During Therapy: WFL for tasks assessed/performed Overall Cognitive Status: Within Functional Limits for tasks assessed                      Exercises Total  Joint Exercises Ankle Circles/Pumps: AROM;Both;10 reps;Supine Quad Sets: AROM;Both;10 reps;Supine Gluteal Sets: Supine Towel Squeeze: AROM;Both;10 reps;Supine Short Arc Quad: AROM;Right Heel Slides: AROM;Right;10 reps;Supine Hip ABduction/ADduction: AROM;Right;10 reps;Supine Straight Leg Raises: AROM;Right;10 reps;Supine Long Arc Quad: 10 reps;AROM;Right Knee Flexion: AROM;Right;Seated Goniometric ROM: 5-90  L knee    General Comments        Pertinent Vitals/Pain Pain Assessment: Faces Pain Score: 1  Faces Pain Scale: Hurts a little bit Pain Location: L knee Pain Descriptors / Indicators: Sore Pain Intervention(s): Monitored during session    Home Living Family/patient expects to be discharged to:: Private residence Living Arrangements: Alone;Spouse/significant other Available Help at Discharge: Family Type of Home: House Home Access: Ramped entrance   Home Layout: One level Home Equipment: Tub bench Additional Comments: will stay at his friends home    Prior Function Level of Independence: Independent          PT Goals (current goals can now be found in the care plan section) Acute Rehab PT Goals Patient Stated Goal: go home today Progress towards PT goals: Progressing toward goals    Frequency  7X/week    PT Plan Current plan remains appropriate    Co-evaluation             End of Session   Activity Tolerance: Patient tolerated treatment well Patient left: in chair;with call bell/phone within reach     Time: 1236-1246 PT Time Calculation (min) (ACUTE ONLY): 10 min  Charges:  $Gait Training: 8-22 mins $Therapeutic Exercise: 8-22 mins $Self Care/Home Management: 8-22  G Codes:      Claretha Cooper 10/18/2014, 12:59 PM

## 2014-10-19 DIAGNOSIS — Z794 Long term (current) use of insulin: Secondary | ICD-10-CM | POA: Diagnosis not present

## 2014-10-19 DIAGNOSIS — Z96652 Presence of left artificial knee joint: Secondary | ICD-10-CM | POA: Diagnosis not present

## 2014-10-19 DIAGNOSIS — I252 Old myocardial infarction: Secondary | ICD-10-CM | POA: Diagnosis not present

## 2014-10-19 DIAGNOSIS — Z87891 Personal history of nicotine dependence: Secondary | ICD-10-CM | POA: Diagnosis not present

## 2014-10-19 DIAGNOSIS — E119 Type 2 diabetes mellitus without complications: Secondary | ICD-10-CM | POA: Diagnosis not present

## 2014-10-19 DIAGNOSIS — Z471 Aftercare following joint replacement surgery: Secondary | ICD-10-CM | POA: Diagnosis not present

## 2014-10-20 DIAGNOSIS — E119 Type 2 diabetes mellitus without complications: Secondary | ICD-10-CM | POA: Diagnosis not present

## 2014-10-20 DIAGNOSIS — Z96652 Presence of left artificial knee joint: Secondary | ICD-10-CM | POA: Diagnosis not present

## 2014-10-20 DIAGNOSIS — Z794 Long term (current) use of insulin: Secondary | ICD-10-CM | POA: Diagnosis not present

## 2014-10-20 DIAGNOSIS — Z87891 Personal history of nicotine dependence: Secondary | ICD-10-CM | POA: Diagnosis not present

## 2014-10-20 DIAGNOSIS — Z471 Aftercare following joint replacement surgery: Secondary | ICD-10-CM | POA: Diagnosis not present

## 2014-10-20 DIAGNOSIS — I252 Old myocardial infarction: Secondary | ICD-10-CM | POA: Diagnosis not present

## 2014-10-23 DIAGNOSIS — Z471 Aftercare following joint replacement surgery: Secondary | ICD-10-CM | POA: Diagnosis not present

## 2014-10-23 DIAGNOSIS — E119 Type 2 diabetes mellitus without complications: Secondary | ICD-10-CM | POA: Diagnosis not present

## 2014-10-23 DIAGNOSIS — Z87891 Personal history of nicotine dependence: Secondary | ICD-10-CM | POA: Diagnosis not present

## 2014-10-23 DIAGNOSIS — Z794 Long term (current) use of insulin: Secondary | ICD-10-CM | POA: Diagnosis not present

## 2014-10-23 DIAGNOSIS — Z96652 Presence of left artificial knee joint: Secondary | ICD-10-CM | POA: Diagnosis not present

## 2014-10-23 DIAGNOSIS — I252 Old myocardial infarction: Secondary | ICD-10-CM | POA: Diagnosis not present

## 2014-10-24 DIAGNOSIS — Z96652 Presence of left artificial knee joint: Secondary | ICD-10-CM | POA: Diagnosis not present

## 2014-10-24 DIAGNOSIS — Z87891 Personal history of nicotine dependence: Secondary | ICD-10-CM | POA: Diagnosis not present

## 2014-10-24 DIAGNOSIS — E119 Type 2 diabetes mellitus without complications: Secondary | ICD-10-CM | POA: Diagnosis not present

## 2014-10-24 DIAGNOSIS — Z471 Aftercare following joint replacement surgery: Secondary | ICD-10-CM | POA: Diagnosis not present

## 2014-10-24 DIAGNOSIS — Z794 Long term (current) use of insulin: Secondary | ICD-10-CM | POA: Diagnosis not present

## 2014-10-24 DIAGNOSIS — I252 Old myocardial infarction: Secondary | ICD-10-CM | POA: Diagnosis not present

## 2014-10-25 DIAGNOSIS — E119 Type 2 diabetes mellitus without complications: Secondary | ICD-10-CM | POA: Diagnosis not present

## 2014-10-25 DIAGNOSIS — Z471 Aftercare following joint replacement surgery: Secondary | ICD-10-CM | POA: Diagnosis not present

## 2014-10-25 DIAGNOSIS — Z87891 Personal history of nicotine dependence: Secondary | ICD-10-CM | POA: Diagnosis not present

## 2014-10-25 DIAGNOSIS — I252 Old myocardial infarction: Secondary | ICD-10-CM | POA: Diagnosis not present

## 2014-10-25 DIAGNOSIS — Z96652 Presence of left artificial knee joint: Secondary | ICD-10-CM | POA: Diagnosis not present

## 2014-10-25 DIAGNOSIS — Z794 Long term (current) use of insulin: Secondary | ICD-10-CM | POA: Diagnosis not present

## 2014-10-27 DIAGNOSIS — M62552 Muscle wasting and atrophy, not elsewhere classified, left thigh: Secondary | ICD-10-CM | POA: Diagnosis not present

## 2014-10-27 DIAGNOSIS — M25562 Pain in left knee: Secondary | ICD-10-CM | POA: Diagnosis not present

## 2014-10-27 DIAGNOSIS — Z96652 Presence of left artificial knee joint: Secondary | ICD-10-CM | POA: Diagnosis not present

## 2014-10-27 DIAGNOSIS — R2689 Other abnormalities of gait and mobility: Secondary | ICD-10-CM | POA: Diagnosis not present

## 2014-10-30 DIAGNOSIS — Z96652 Presence of left artificial knee joint: Secondary | ICD-10-CM | POA: Diagnosis not present

## 2014-10-30 DIAGNOSIS — M62552 Muscle wasting and atrophy, not elsewhere classified, left thigh: Secondary | ICD-10-CM | POA: Diagnosis not present

## 2014-10-30 DIAGNOSIS — R2689 Other abnormalities of gait and mobility: Secondary | ICD-10-CM | POA: Diagnosis not present

## 2014-10-30 DIAGNOSIS — M25562 Pain in left knee: Secondary | ICD-10-CM | POA: Diagnosis not present

## 2014-10-30 NOTE — Discharge Summary (Signed)
Physician Discharge Summary  Patient ID: Jeffrey Kelly MRN: [id number] DOB/AGE: May 31, 1934 80 y.o.  Admit date: 10/17/2014 Discharge date: 10/18/2014   Procedures:  Procedure(s) (LRB): LEFT TOTAL KNEE ARTHROPLASTY (Left)  Attending Physician:  Dr. Paralee Cancel   Admission Diagnoses:   Left knee primary OA / pain  Discharge Diagnoses:  Principal Problem:   S/P left TKA Active Problems:   S/P knee replacement  Past Medical History  Diagnosis Date  . Myocardial infarction 2004  . Dysrhythmia   . Shortness of breath dyspnea     with exertion   . Diabetes mellitus without complication   . GERD (gastroesophageal reflux disease)   . Arthritis   . Urinary tract infection     hx of hopsitalization 10/2013     HPI:    Jeffrey Kelly, 80 y.o. male, has a history of pain and functional disability in the left knee due to arthritis and has failed non-surgical conservative treatments for greater than 12 weeks to include NSAID's and/or analgesics, corticosteriod injections, viscosupplementation injections and activity modification. Onset of symptoms was gradual, starting 2+ years ago with gradually worsening course since that time. The patient noted no past surgery on the left knee(s). Patient currently rates pain in the left knee(s) at 10 out of 10 with activity. Patient has night pain, worsening of pain with activity and weight bearing, pain that interferes with activities of daily living, pain with passive range of motion, crepitus and joint swelling. Patient has evidence of periarticular osteophytes and joint space narrowing by imaging studies. There is no active infection. Risks, benefits and expectations were discussed with the patient. Risks including but not limited to the risk of anesthesia, blood clots, nerve damage, blood vessel damage, failure of the prosthesis, infection and up to and including death. Patient understand the risks, benefits and expectations and wishes to proceed  with surgery.   PCP: No primary care provider on file.   Discharged Condition: good  Hospital Course:  Patient underwent the above stated procedure on 10/17/2014. Patient tolerated the procedure well and brought to the recovery room in good condition and subsequently to the floor.  POD #1 BP: 114/51 ; Pulse: 58 ; Temp: 97.7 F (36.5 C) ; Resp: 16 Patient reports pain as mild, pain controlled. No events throughout the night. Dr. Alvan Dame discussed with the patient regarding his increased creat, the patient does have an elevate value from 1 week ago and is normal for the patient. He is already moving the leg really well while in bed. Ready to be discharged home. Dorsiflexion/plantar flexion intact, incision: dressing C/D/I, no cellulitis present and compartment soft.   LABS  Basename    HGB  12.2  HCT  35.8    Discharge Exam: General appearance: alert, cooperative and no distress Extremities: Homans sign is negative, no sign of DVT, no edema, redness or tenderness in the calves or thighs and no ulcers, gangrene or trophic changes  Disposition: Home with follow up in 2 weeks   Follow-up Information    Follow up with Mauri Pole, MD. Schedule an appointment as soon as possible for a visit in 2 weeks.   Specialty:  Orthopedic Surgery   Contact information:   [address] [place] [address] [phone]       Follow up with [facility] ([place]).   Why:  home health physical therapy   Contact information:   [address] [place] [address] [phone]  Follow up with [facility].   Why:  rolling walker   Contact information:   [address] [place] [address] [phone]       Discharge Instructions    Call MD / Call 911    Complete by:  As directed   If you experience chest pain or shortness of breath, CALL 911 and be transported to the hospital emergency room.  If you develope a fever above 101 F,  pus (white drainage) or increased drainage or redness at the wound, or calf pain, call your surgeon's office.     Change dressing    Complete by:  As directed   Maintain surgical dressing until follow up in the clinic. If the edges start to pull up, may reinforce with tape. If the dressing is no longer working, may remove and cover with gauze and tape, but must keep the area dry and clean.  Call with any questions or concerns.     Constipation Prevention    Complete by:  As directed   Drink plenty of fluids.  Prune juice may be helpful.  You may use a stool softener, such as Colace (over the counter) 100 mg twice a day.  Use MiraLax (over the counter) for constipation as needed.     Diet - low sodium heart healthy    Complete by:  As directed      Discharge instructions    Complete by:  As directed   Maintain surgical dressing until follow up in the clinic. If the edges start to pull up, may reinforce with tape. If the dressing is no longer working, may remove and cover with gauze and tape, but must keep the area dry and clean.  Follow up in 2 weeks at [facility]. Call with any questions or concerns.     Increase activity slowly as tolerated    Complete by:  As directed   Weight bearing as tolerated with assist device (walker, cane, etc) as directed, use it as long as suggested by your surgeon or therapist, typically at least 4-6 weeks.     TED hose    Complete by:  As directed   Use stockings (TED hose) for 2 weeks on both leg(s).  You may remove them at night for sleeping.             Medication List    STOP taking these medications        lisinopril 10 MG tablet  Commonly known as:  PRINIVIL,ZESTRIL     naproxen sodium 220 MG tablet  Commonly known as:  ANAPROX      TAKE these medications        aspirin 325 MG EC tablet  Take 1 tablet (325 mg total) by mouth 2 (two) times daily.     atorvastatin 80 MG tablet  Commonly known as:  LIPITOR  Take 40 mg by mouth  at bedtime.     docusate sodium 100 MG capsule  Commonly known as:  COLACE  Take 1 capsule (100 mg total) by mouth 2 (two) times daily.     ferrous sulfate 325 (65 FE) MG tablet  Take 1 tablet (325 mg total) by mouth 3 (three) times daily after meals.     HYDROcodone-acetaminophen 7.5-325 MG tablet  Commonly known as:  NORCO  Take 1-2 tablets by mouth every 4 (four) hours as needed for moderate pain.     insulin NPH-regular Human (70-30) 100 UNIT/ML injection  Commonly known as:  NOVOLIN 70/30  Inject 50-60 Units into the skin 2 (two) times daily with a meal. TAKES 50 UNITS IN THE MORNING AND 60 AT NIGHT     magnesium oxide 400 MG tablet  Commonly known as:  MAG-OX  Take 400 mg by mouth daily.     methocarbamol 500 MG tablet  Commonly known as:  ROBAXIN  Take 1 tablet (500 mg total) by mouth every 6 (six) hours as needed for muscle spasms.     metoprolol tartrate 25 MG tablet  Commonly known as:  LOPRESSOR  Take 25 mg by mouth 2 (two) times daily.     polyethylene glycol packet  Commonly known as:  MIRALAX / GLYCOLAX  Take 17 g by mouth daily as needed for mild constipation.     ranitidine 150 MG tablet  Commonly known as:  ZANTAC  Take 150 mg by mouth daily.     tamsulosin 0.4 MG Caps capsule  Commonly known as:  FLOMAX  Take 0.4 mg by mouth daily.         Signed: West Pugh. Babish   PA-C  10/30/2014, 3:22 PM

## 2014-10-31 DIAGNOSIS — M25562 Pain in left knee: Secondary | ICD-10-CM | POA: Diagnosis not present

## 2014-10-31 DIAGNOSIS — R2689 Other abnormalities of gait and mobility: Secondary | ICD-10-CM | POA: Diagnosis not present

## 2014-10-31 DIAGNOSIS — Z96652 Presence of left artificial knee joint: Secondary | ICD-10-CM | POA: Diagnosis not present

## 2014-10-31 DIAGNOSIS — M62552 Muscle wasting and atrophy, not elsewhere classified, left thigh: Secondary | ICD-10-CM | POA: Diagnosis not present

## 2014-11-01 DIAGNOSIS — Z96652 Presence of left artificial knee joint: Secondary | ICD-10-CM | POA: Diagnosis not present

## 2014-11-01 DIAGNOSIS — Z471 Aftercare following joint replacement surgery: Secondary | ICD-10-CM | POA: Diagnosis not present

## 2014-11-03 DIAGNOSIS — Z96652 Presence of left artificial knee joint: Secondary | ICD-10-CM | POA: Diagnosis not present

## 2014-11-03 DIAGNOSIS — M62552 Muscle wasting and atrophy, not elsewhere classified, left thigh: Secondary | ICD-10-CM | POA: Diagnosis not present

## 2014-11-03 DIAGNOSIS — M25562 Pain in left knee: Secondary | ICD-10-CM | POA: Diagnosis not present

## 2014-11-03 DIAGNOSIS — R2689 Other abnormalities of gait and mobility: Secondary | ICD-10-CM | POA: Diagnosis not present

## 2014-11-06 DIAGNOSIS — R2689 Other abnormalities of gait and mobility: Secondary | ICD-10-CM | POA: Diagnosis not present

## 2014-11-06 DIAGNOSIS — M25562 Pain in left knee: Secondary | ICD-10-CM | POA: Diagnosis not present

## 2014-11-06 DIAGNOSIS — Z96652 Presence of left artificial knee joint: Secondary | ICD-10-CM | POA: Diagnosis not present

## 2014-11-06 DIAGNOSIS — M62552 Muscle wasting and atrophy, not elsewhere classified, left thigh: Secondary | ICD-10-CM | POA: Diagnosis not present

## 2014-11-08 DIAGNOSIS — Z96652 Presence of left artificial knee joint: Secondary | ICD-10-CM | POA: Diagnosis not present

## 2014-11-08 DIAGNOSIS — R2689 Other abnormalities of gait and mobility: Secondary | ICD-10-CM | POA: Diagnosis not present

## 2014-11-08 DIAGNOSIS — M62552 Muscle wasting and atrophy, not elsewhere classified, left thigh: Secondary | ICD-10-CM | POA: Diagnosis not present

## 2014-11-08 DIAGNOSIS — M25562 Pain in left knee: Secondary | ICD-10-CM | POA: Diagnosis not present

## 2014-11-10 DIAGNOSIS — M25562 Pain in left knee: Secondary | ICD-10-CM | POA: Diagnosis not present

## 2014-11-10 DIAGNOSIS — M62552 Muscle wasting and atrophy, not elsewhere classified, left thigh: Secondary | ICD-10-CM | POA: Diagnosis not present

## 2014-11-10 DIAGNOSIS — Z96652 Presence of left artificial knee joint: Secondary | ICD-10-CM | POA: Diagnosis not present

## 2014-11-10 DIAGNOSIS — R2689 Other abnormalities of gait and mobility: Secondary | ICD-10-CM | POA: Diagnosis not present

## 2014-11-13 DIAGNOSIS — M25562 Pain in left knee: Secondary | ICD-10-CM | POA: Diagnosis not present

## 2014-11-13 DIAGNOSIS — M62552 Muscle wasting and atrophy, not elsewhere classified, left thigh: Secondary | ICD-10-CM | POA: Diagnosis not present

## 2014-11-13 DIAGNOSIS — R2689 Other abnormalities of gait and mobility: Secondary | ICD-10-CM | POA: Diagnosis not present

## 2014-11-13 DIAGNOSIS — Z96652 Presence of left artificial knee joint: Secondary | ICD-10-CM | POA: Diagnosis not present

## 2014-11-15 DIAGNOSIS — M62552 Muscle wasting and atrophy, not elsewhere classified, left thigh: Secondary | ICD-10-CM | POA: Diagnosis not present

## 2014-11-15 DIAGNOSIS — Z96652 Presence of left artificial knee joint: Secondary | ICD-10-CM | POA: Diagnosis not present

## 2014-11-15 DIAGNOSIS — M25562 Pain in left knee: Secondary | ICD-10-CM | POA: Diagnosis not present

## 2014-11-15 DIAGNOSIS — R2689 Other abnormalities of gait and mobility: Secondary | ICD-10-CM | POA: Diagnosis not present

## 2014-11-17 DIAGNOSIS — Z96652 Presence of left artificial knee joint: Secondary | ICD-10-CM | POA: Diagnosis not present

## 2014-11-17 DIAGNOSIS — R2689 Other abnormalities of gait and mobility: Secondary | ICD-10-CM | POA: Diagnosis not present

## 2014-11-17 DIAGNOSIS — M62552 Muscle wasting and atrophy, not elsewhere classified, left thigh: Secondary | ICD-10-CM | POA: Diagnosis not present

## 2014-11-17 DIAGNOSIS — M25562 Pain in left knee: Secondary | ICD-10-CM | POA: Diagnosis not present

## 2014-11-20 DIAGNOSIS — M25562 Pain in left knee: Secondary | ICD-10-CM | POA: Diagnosis not present

## 2014-11-20 DIAGNOSIS — R2689 Other abnormalities of gait and mobility: Secondary | ICD-10-CM | POA: Diagnosis not present

## 2014-11-20 DIAGNOSIS — Z96652 Presence of left artificial knee joint: Secondary | ICD-10-CM | POA: Diagnosis not present

## 2014-11-20 DIAGNOSIS — M62552 Muscle wasting and atrophy, not elsewhere classified, left thigh: Secondary | ICD-10-CM | POA: Diagnosis not present

## 2014-11-22 DIAGNOSIS — R2689 Other abnormalities of gait and mobility: Secondary | ICD-10-CM | POA: Diagnosis not present

## 2014-11-22 DIAGNOSIS — M62552 Muscle wasting and atrophy, not elsewhere classified, left thigh: Secondary | ICD-10-CM | POA: Diagnosis not present

## 2014-11-22 DIAGNOSIS — Z96652 Presence of left artificial knee joint: Secondary | ICD-10-CM | POA: Diagnosis not present

## 2014-11-22 DIAGNOSIS — M25562 Pain in left knee: Secondary | ICD-10-CM | POA: Diagnosis not present

## 2014-11-24 DIAGNOSIS — R2689 Other abnormalities of gait and mobility: Secondary | ICD-10-CM | POA: Diagnosis not present

## 2014-11-24 DIAGNOSIS — M25562 Pain in left knee: Secondary | ICD-10-CM | POA: Diagnosis not present

## 2014-11-24 DIAGNOSIS — M62552 Muscle wasting and atrophy, not elsewhere classified, left thigh: Secondary | ICD-10-CM | POA: Diagnosis not present

## 2014-11-24 DIAGNOSIS — Z96652 Presence of left artificial knee joint: Secondary | ICD-10-CM | POA: Diagnosis not present

## 2014-11-27 DIAGNOSIS — Z96652 Presence of left artificial knee joint: Secondary | ICD-10-CM | POA: Diagnosis not present

## 2014-11-27 DIAGNOSIS — M62552 Muscle wasting and atrophy, not elsewhere classified, left thigh: Secondary | ICD-10-CM | POA: Diagnosis not present

## 2014-11-27 DIAGNOSIS — M25562 Pain in left knee: Secondary | ICD-10-CM | POA: Diagnosis not present

## 2014-11-27 DIAGNOSIS — R2689 Other abnormalities of gait and mobility: Secondary | ICD-10-CM | POA: Diagnosis not present

## 2014-11-28 DIAGNOSIS — N200 Calculus of kidney: Secondary | ICD-10-CM | POA: Diagnosis not present

## 2014-11-28 DIAGNOSIS — N289 Disorder of kidney and ureter, unspecified: Secondary | ICD-10-CM | POA: Diagnosis not present

## 2014-11-29 DIAGNOSIS — Z96652 Presence of left artificial knee joint: Secondary | ICD-10-CM | POA: Diagnosis not present

## 2014-11-29 DIAGNOSIS — Z471 Aftercare following joint replacement surgery: Secondary | ICD-10-CM | POA: Diagnosis not present

## 2015-01-03 DIAGNOSIS — B9689 Other specified bacterial agents as the cause of diseases classified elsewhere: Secondary | ICD-10-CM | POA: Diagnosis not present

## 2015-01-03 DIAGNOSIS — J208 Acute bronchitis due to other specified organisms: Secondary | ICD-10-CM | POA: Diagnosis not present

## 2015-01-03 DIAGNOSIS — I119 Hypertensive heart disease without heart failure: Secondary | ICD-10-CM | POA: Diagnosis not present

## 2015-01-10 DIAGNOSIS — Z471 Aftercare following joint replacement surgery: Secondary | ICD-10-CM | POA: Diagnosis not present

## 2015-01-10 DIAGNOSIS — Z96652 Presence of left artificial knee joint: Secondary | ICD-10-CM | POA: Diagnosis not present

## 2015-05-04 DIAGNOSIS — H2513 Age-related nuclear cataract, bilateral: Secondary | ICD-10-CM | POA: Diagnosis not present

## 2015-05-16 DIAGNOSIS — H2512 Age-related nuclear cataract, left eye: Secondary | ICD-10-CM | POA: Diagnosis not present

## 2015-05-16 DIAGNOSIS — H25812 Combined forms of age-related cataract, left eye: Secondary | ICD-10-CM | POA: Diagnosis not present

## 2015-05-29 DIAGNOSIS — N200 Calculus of kidney: Secondary | ICD-10-CM | POA: Diagnosis not present

## 2015-06-27 DIAGNOSIS — H25811 Combined forms of age-related cataract, right eye: Secondary | ICD-10-CM | POA: Diagnosis not present

## 2015-06-27 DIAGNOSIS — H2511 Age-related nuclear cataract, right eye: Secondary | ICD-10-CM | POA: Diagnosis not present

## 2015-07-05 DIAGNOSIS — Z0000 Encounter for general adult medical examination without abnormal findings: Secondary | ICD-10-CM | POA: Diagnosis not present

## 2015-07-05 DIAGNOSIS — N183 Chronic kidney disease, stage 3 (moderate): Secondary | ICD-10-CM | POA: Diagnosis not present

## 2015-07-05 DIAGNOSIS — Z794 Long term (current) use of insulin: Secondary | ICD-10-CM | POA: Diagnosis not present

## 2015-07-05 DIAGNOSIS — I119 Hypertensive heart disease without heart failure: Secondary | ICD-10-CM | POA: Diagnosis not present

## 2015-07-05 DIAGNOSIS — E1122 Type 2 diabetes mellitus with diabetic chronic kidney disease: Secondary | ICD-10-CM | POA: Diagnosis not present

## 2015-07-05 DIAGNOSIS — E1165 Type 2 diabetes mellitus with hyperglycemia: Secondary | ICD-10-CM | POA: Diagnosis not present

## 2015-07-30 DIAGNOSIS — A681 Tick-borne relapsing fever: Secondary | ICD-10-CM | POA: Diagnosis not present

## 2015-07-30 DIAGNOSIS — R0600 Dyspnea, unspecified: Secondary | ICD-10-CM | POA: Diagnosis not present

## 2015-07-30 DIAGNOSIS — R0602 Shortness of breath: Secondary | ICD-10-CM | POA: Diagnosis not present

## 2015-07-30 DIAGNOSIS — S40861A Insect bite (nonvenomous) of right upper arm, initial encounter: Secondary | ICD-10-CM | POA: Diagnosis not present

## 2015-07-30 DIAGNOSIS — R05 Cough: Secondary | ICD-10-CM | POA: Diagnosis not present

## 2015-09-25 ENCOUNTER — Other Ambulatory Visit: Payer: Self-pay

## 2015-10-03 DIAGNOSIS — Z96652 Presence of left artificial knee joint: Secondary | ICD-10-CM | POA: Diagnosis not present

## 2015-10-03 DIAGNOSIS — M1711 Unilateral primary osteoarthritis, right knee: Secondary | ICD-10-CM | POA: Diagnosis not present

## 2015-10-03 DIAGNOSIS — M1712 Unilateral primary osteoarthritis, left knee: Secondary | ICD-10-CM | POA: Diagnosis not present

## 2015-10-03 DIAGNOSIS — Z471 Aftercare following joint replacement surgery: Secondary | ICD-10-CM | POA: Diagnosis not present

## 2015-10-16 DIAGNOSIS — E782 Mixed hyperlipidemia: Secondary | ICD-10-CM | POA: Diagnosis not present

## 2015-10-16 DIAGNOSIS — E1122 Type 2 diabetes mellitus with diabetic chronic kidney disease: Secondary | ICD-10-CM | POA: Diagnosis not present

## 2015-10-19 DIAGNOSIS — M1711 Unilateral primary osteoarthritis, right knee: Secondary | ICD-10-CM | POA: Diagnosis not present

## 2015-10-30 DIAGNOSIS — Z794 Long term (current) use of insulin: Secondary | ICD-10-CM | POA: Diagnosis not present

## 2015-10-30 DIAGNOSIS — R809 Proteinuria, unspecified: Secondary | ICD-10-CM | POA: Diagnosis not present

## 2015-10-30 DIAGNOSIS — E1129 Type 2 diabetes mellitus with other diabetic kidney complication: Secondary | ICD-10-CM | POA: Diagnosis not present

## 2015-10-30 DIAGNOSIS — Z23 Encounter for immunization: Secondary | ICD-10-CM | POA: Diagnosis not present

## 2015-10-30 DIAGNOSIS — E782 Mixed hyperlipidemia: Secondary | ICD-10-CM | POA: Diagnosis not present

## 2015-11-06 DIAGNOSIS — H8113 Benign paroxysmal vertigo, bilateral: Secondary | ICD-10-CM | POA: Diagnosis not present

## 2015-11-06 DIAGNOSIS — J019 Acute sinusitis, unspecified: Secondary | ICD-10-CM | POA: Diagnosis not present

## 2015-11-06 DIAGNOSIS — B9689 Other specified bacterial agents as the cause of diseases classified elsewhere: Secondary | ICD-10-CM | POA: Diagnosis not present

## 2015-11-08 NOTE — H&P (Signed)
TOTAL KNEE ADMISSION H&P  Patient is being admitted for right total knee arthroplasty.  Subjective:  Chief Complaint:    Right knee primary OA / pain  HPI: Farrel Gobble, 81 y.o. male, has a history of pain and functional disability in the right knee due to arthritis and has failed non-surgical conservative treatments for greater than 12 weeks to include NSAID's and/or analgesics, corticosteriod injections, viscosupplementation injections and activity modification.  Onset of symptoms was gradual, starting 1+ years ago with gradually worsening course since that time. The patient noted prior procedures on the knee to include  Menisectomy (open) on the right knee(s).  Patient currently rates pain in the right knee(s) at 8 out of 10 with activity. Patient has worsening of pain with activity and weight bearing, pain that interferes with activities of daily living, pain with passive range of motion, crepitus and joint swelling.  Patient has evidence of periarticular osteophytes and joint space narrowing by imaging studies.  There is no active infection.   Risks, benefits and expectations were discussed with the patient.  Risks including but not limited to the risk of anesthesia, blood clots, nerve damage, blood vessel damage, failure of the prosthesis, infection and up to and including death.  Patient understand the risks, benefits and expectations and wishes to proceed with surgery.   PCP: No primary care provider on file.  D/C Plans:      Home  Post-op Meds:       No Rx given  Tranexamic Acid:      To be given - IV   Decadron:      Is to be given  FYI:     ASA  Norco  Dilaudid (ok per pt)    Patient Active Problem List   Diagnosis Date Noted  . S/P left TKA 10/17/2014  . S/P knee replacement 10/17/2014   Past Medical History:  Diagnosis Date  . Arthritis   . Diabetes mellitus without complication   . Dysrhythmia   . GERD (gastroesophageal reflux disease)   . Myocardial infarction ([place])  2004  . Shortness of breath dyspnea    with exertion   . Urinary tract infection    hx of hopsitalization 10/2013     Past Surgical History:  Procedure Laterality Date  . ABDOMINAL ANGIOGRAM  1994  . ABDOMINAL AORTIC ANEURYSM REPAIR  1996   . BACK SURGERY  2002   2008 and 2002   . CHOLECYSTECTOMY  2004  . COLONOSCOPY  2012   . CORONARY ANGIOPLASTY  1993  . CORONARY ARTERY BYPASS GRAFT  2004  . excision of scrotal lipoma     . HERNIA REPAIR     02/01/2014    . left ear surgery   1980   x 2   . left hydrocelectomy   01/2014   . left knee scope   2007   . left spermatocelectomy   01/2014   . LITHOTRIPSY  08/21/2014   . right knee torn cartilage surgery   1970  . scrotal cyst removed   1999  . TOTAL KNEE ARTHROPLASTY Left 10/17/2014   Procedure: LEFT TOTAL KNEE ARTHROPLASTY;  Surgeon: Paralee Cancel, MD;  Location: [facility];  Service: Orthopedics;  Laterality: Left;  . trigger finer release   2011   . VASECTOMY  1999    No prescriptions prior to admission.   Allergies  Allergen Reactions  . Morphine And Related Nausea And Vomiting  . Proctofoam [Pramoxine Hcl]     Blisters  skin  . Proctosol [Hydrocortisone]     Blisters skin    Social History  Substance Use Topics  . Smoking status: Former Research scientist (life sciences)  . Smokeless tobacco: Never Used  . Alcohol use No     Comment: hx of 20 years ago        Review of Systems  Constitutional: Negative.   HENT: Positive for hearing loss and tinnitus.   Eyes: Negative.   Respiratory: Positive for shortness of breath (on exertion).   Cardiovascular: Negative.   Gastrointestinal: Negative.   Genitourinary: Positive for frequency.  Musculoskeletal: Positive for joint pain.  Skin: Negative.   Neurological: Negative.   Endo/Heme/Allergies: Negative.   Psychiatric/Behavioral: Negative.     Objective:  Physical Exam  Constitutional: He is oriented to person, place, and time. He appears well-developed.  HENT:  Head: Normocephalic.   Mouth/Throat: He has dentures.  Eyes: Pupils are equal, round, and reactive to light.  Neck: Neck supple. No JVD present. No tracheal deviation present. No thyromegaly present.  Cardiovascular: Normal rate, regular rhythm, normal heart sounds and intact distal pulses.   Respiratory: Effort normal and breath sounds normal. No respiratory distress. He has no wheezes.  GI: Soft. There is no tenderness. There is no guarding.  Musculoskeletal:       Right knee: He exhibits decreased range of motion, swelling and bony tenderness. He exhibits no ecchymosis, no deformity, no laceration and no erythema. Tenderness found.  Lymphadenopathy:    He has no cervical adenopathy.  Neurological: He is alert and oriented to person, place, and time.  Skin: Skin is warm and dry.  Psychiatric: He has a normal mood and affect.      Labs:  Estimated body mass index is 31.39 kg/m as calculated from the following:   Height as of 10/17/14: 5' 3.5" (1.613 m).   Weight as of 10/17/14: 81.6 kg (180 lb).   Imaging Review Plain radiographs demonstrate severe degenerative joint disease of the right knee(s). The overall alignment is neutral. The bone quality appears to be good for age and reported activity level.  Assessment/Plan:  End stage arthritis, right knee   The patient history, physical examination, clinical judgment of the provider and imaging studies are consistent with end stage degenerative joint disease of the right knee(s) and total knee arthroplasty is deemed medically necessary. The treatment options including medical management, injection therapy arthroscopy and arthroplasty were discussed at length. The risks and benefits of total knee arthroplasty were presented and reviewed. The risks due to aseptic loosening, infection, stiffness, patella tracking problems, thromboembolic complications and other imponderables were discussed. The patient acknowledged the explanation, agreed to proceed with the plan  and consent was signed. Patient is being admitted for inpatient treatment for surgery, pain control, PT, OT, prophylactic antibiotics, VTE prophylaxis, progressive ambulation and ADL's and discharge planning. The patient is planning to be discharged home.      West Pugh Mylin Hirano   PA-C  11/08/2015, 1:52 PM

## 2015-11-19 NOTE — Patient Instructions (Addendum)
CAIDE FRIEDMAN  11/19/2015   Your procedure is scheduled on: 11/27/15  Report to [facility] Main  Entrance take [place]  elevators to 3rd floor to  [place] at 0545 AM.  Call this number if you have problems the morning of surgery [phone]   Remember: ONLY 1 PERSON MAY GO WITH YOU TO SHORT STAY TO GET  READY MORNING OF [place].  Do not eat food or drink liquids :After Midnight.     Take these medicines the morning of surgery with A SIP OF WATER: METOPROLOL, Zyrtec, Zantac, tamulosin May take  ROBAXINl if needed                                You may not have any metal on your body including hair pins and              piercings  Do not wear jewelry, make-up, lotions, powders or perfumes, deodorant             Do not wear nail polish.  Do not shave  48 hours prior to surgery.              Men may shave face and neck.   Do not bring valuables to the hospital. [place].  Contacts, dentures or bridgework may not be worn into surgery.  Leave suitcase in the car. After surgery it may be brought to your room.                 Please read over the following fact sheets you were given: _____________________________________________________________________             How to Manage Your Diabetes Before and After Surgery  Why is it important to control my blood sugar before and after surgery? . Improving blood sugar levels before and after surgery helps healing and can limit problems. . A way of improving blood sugar control is eating a healthy diet by: o  Eating less sugar and carbohydrates o  Increasing activity/exercise o  Talking with your doctor about reaching your blood sugar goals . High blood sugars (greater than 180 mg/dL) can raise your risk of infections and slow your recovery, so you will need to focus on controlling your diabetes during the weeks before surgery. . Make sure that the  doctor who takes care of your diabetes knows about your planned surgery including the date and location.  How do I manage my blood sugar before surgery? . Check your blood sugar at least 4 times a day, starting 2 days before surgery, to make sure that the level is not too high or low. o Check your blood sugar the morning of your surgery when you wake up and every 2 hours until you get to the Short Stay unit. . If your blood sugar is less than 70 mg/dL, you will need to treat for low blood sugar: o Do not take insulin. o Treat a low blood sugar (less than 70 mg/dL) with  cup of clear juice (cranberry or apple), 4 glucose tablets, OR glucose gel. o Recheck blood sugar in 15 minutes after treatment (to make sure it is greater than 70 mg/dL). If your blood sugar is not greater  than 70 mg/dL on recheck, call [phone] for further instructions. . Report your blood sugar to the short stay nurse when you get to Short Stay.  . If you are admitted to the hospital after surgery: o Your blood sugar will be checked by the staff and you will probably be given insulin after surgery (instead of oral diabetes medicines) to make sure you have good blood sugar levels. o The goal for blood sugar control after surgery is 80-180 mg/dL.   WHAT DO I DO ABOUT MY DIABETES MEDICATION?   THE MORNING OF THE DAY BEFORE SURGERY TAKE 35U 70/30 NovOlin insulin  . Do not take oral diabetes medicines (pills) the morning of surgery.  . THE NIGHT BEFORE SURGERY, take ____42_______ units of _Novulin 70/30__________insulin.       . THE MORNING OF SURGERY, take ___0 zero none__________ units of __________insulin. DO NOT TAKE ANY ORAL DIABETIC MEDICATION MORNING OF SURGERY . The day of surgery, do not take other diabetes injectables, including Byetta (exenatide), Bydureon (exenatide ER), Victoza (liraglutide), or Trulicity (dulaglutide).  . If your CBG is greater than 220 mg/dL, you may take  of your sliding scale  (correction) dose of insulin.  Patient Signature:  Date:   Nurse Signature:  Date:   Reviewed and Endorsed by [facility] Patient Education Committee, August 2015[facility] - Preparing for Surgery Before surgery, you can play an important role.  Because skin is not sterile, your skin needs to be as free of germs as possible.  You can reduce the number of germs on your skin by washing with CHG (chlorahexidine gluconate) soap before surgery.  CHG is an antiseptic cleaner which kills germs and bonds with the skin to continue killing germs even after washing. Please DO NOT use if you have an allergy to CHG or antibacterial soaps.  If your skin becomes reddened/irritated stop using the CHG and inform your nurse when you arrive at Short Stay. Do not shave (including legs and underarms) for at least 48 hours prior to the first CHG shower.  You may shave your face/neck. Please follow these instructions carefully:  1.  Shower with CHG Soap the night before surgery and the  morning of Surgery.  2.  If you choose to wash your hair, wash your hair first as usual with your  normal  shampoo.  3.  After you shampoo, rinse your hair and body thoroughly to remove the  shampoo.                           4.  Use CHG as you would any other liquid soap.  You can apply chg directly  to the skin and wash                       Gently with a scrungie or clean washcloth.  5.  Apply the CHG Soap to your body ONLY FROM THE NECK DOWN.   Do not use on face/ open                           Wound or open sores. Avoid contact with eyes, ears mouth and genitals (private parts).                       Wash face,  Genitals (private parts) with your normal soap.  6.  Wash thoroughly, paying special attention to the area where your surgery  will be performed.  7.  Thoroughly rinse your body with warm water from the neck down.  8.  DO NOT shower/wash with your normal soap after using and rinsing off  the CHG Soap.                 9.  Pat yourself dry with a clean towel.            10.  Wear clean pajamas.            11.  Place clean sheets on your bed the night of your first shower and do not  sleep with pets. Day of Surgery : Do not apply any lotions/deodorants the morning of surgery.  Please wear clean clothes to the hospital/surgery center.  FAILURE TO FOLLOW THESE INSTRUCTIONS MAY RESULT IN THE CANCELLATION OF YOUR SURGERY PATIENT SIGNATURE_________________________________  NURSE SIGNATURE__________________________________  ________________________________________________________________________   Adam Phenix  An incentive spirometer is a tool that can help keep your lungs clear and active. This tool measures how well you are filling your lungs with each breath. Taking long deep breaths may help reverse or decrease the chance of developing breathing (pulmonary) problems (especially infection) following:  A long period of time when you are unable to move or be active. BEFORE THE PROCEDURE   If the spirometer includes an indicator to show your best effort, your nurse or respiratory therapist will set it to a desired goal.  If possible, sit up straight or lean slightly forward. Try not to slouch.  Hold the incentive spirometer in an upright position. INSTRUCTIONS FOR USE  1. Sit on the edge of your bed if possible, or sit up as far as you can in bed or on a chair. 2. Hold the incentive spirometer in an upright position. 3. Breathe out normally. 4. Place the mouthpiece in your mouth and seal your lips tightly around it. 5. Breathe in slowly and as deeply as possible, raising the piston or the ball toward the top of the column. 6. Hold your breath for 3-5 seconds or for as long as possible. Allow the piston or ball to fall to the bottom of the column. 7. Remove the mouthpiece from your mouth and breathe out normally. 8. Rest for a few seconds and repeat Steps 1 through 7 at least 10 times every  1-2 hours when you are awake. Take your time and take a few normal breaths between deep breaths. 9. The spirometer may include an indicator to show your best effort. Use the indicator as a goal to work toward during each repetition. 10. After each set of 10 deep breaths, practice coughing to be sure your lungs are clear. If you have an incision (the cut made at the time of surgery), support your incision when coughing by placing a pillow or rolled up towels firmly against it. Once you are able to get out of bed, walk around indoors and cough well. You may stop using the incentive spirometer when instructed by your caregiver.  RISKS AND COMPLICATIONS  Take your time so you do not get dizzy or light-headed.  If you are in pain, you may need to take or ask for pain medication before doing incentive spirometry. It is harder to take a deep breath if you are having pain. AFTER USE  Rest and breathe slowly and easily.  It can be helpful to keep track of a log of  your progress. Your caregiver can provide you with a simple table to help with this. If you are using the spirometer at home, follow these instructions: [place] IF:   You are having difficultly using the spirometer.  You have trouble using the spirometer as often as instructed.  Your pain medication is not giving enough relief while using the spirometer.  You develop fever of 100.5 F (38.1 C) or higher. SEEK IMMEDIATE MEDICAL CARE IF:   You cough up bloody sputum that had not been present before.  You develop fever of 102 F (38.9 C) or greater.  You develop worsening pain at or near the incision site. MAKE SURE YOU:   Understand these instructions.  Will watch your condition.  Will get help right away if you are not doing well or get worse. Document Released: 05/26/2006 Document Revised: 04/07/2011 Document Reviewed: 07/27/2006 ExitCare Patient Information 2014 ExitCare,  [place].   ________________________________________________________________________  WHAT IS A BLOOD TRANSFUSION? Blood Transfusion Information  A transfusion is the replacement of blood or some of its parts. Blood is made up of multiple cells which provide different functions.  Red blood cells carry oxygen and are used for blood loss replacement.  White blood cells fight against infection.  Platelets control bleeding.  Plasma helps clot blood.  Other blood products are available for specialized needs, such as hemophilia or other clotting disorders. BEFORE THE TRANSFUSION  Who gives blood for transfusions?   Healthy volunteers who are fully evaluated to make sure their blood is safe. This is blood bank blood. Transfusion therapy is the safest it has ever been in the practice of medicine. Before blood is taken from a donor, a complete history is taken to make sure that person has no history of diseases nor engages in risky social behavior (examples are intravenous drug use or sexual activity with multiple partners). The donor's travel history is screened to minimize risk of transmitting infections, such as malaria. The donated blood is tested for signs of infectious diseases, such as HIV and hepatitis. The blood is then tested to be sure it is compatible with you in order to minimize the chance of a transfusion reaction. If you or a relative donates blood, this is often done in anticipation of surgery and is not appropriate for emergency situations. It takes many days to process the donated blood. RISKS AND COMPLICATIONS Although transfusion therapy is very safe and saves many lives, the main dangers of transfusion include:   Getting an infectious disease.  Developing a transfusion reaction. This is an allergic reaction to something in the blood you were given. Every precaution is taken to prevent this. The decision to have a blood transfusion has been considered carefully by your caregiver  before blood is given. Blood is not given unless the benefits outweigh the risks. AFTER THE TRANSFUSION  Right after receiving a blood transfusion, you will usually feel much better and more energetic. This is especially true if your red blood cells have gotten low (anemic). The transfusion raises the level of the red blood cells which carry oxygen, and this usually causes an energy increase.  The nurse administering the transfusion will monitor you carefully for complications. HOME CARE INSTRUCTIONS  No special instructions are needed after a transfusion. You may find your energy is better. Speak with your caregiver about any limitations on activity for underlying diseases you may have. SEEK MEDICAL CARE IF:   Your condition is not improving after your transfusion.  You develop redness  or irritation at the intravenous (IV) site. SEEK IMMEDIATE MEDICAL CARE IF:  Any of the following symptoms occur over the next 12 hours:  Shaking chills.  You have a temperature by mouth above 102 F (38.9 C), not controlled by medicine.  Chest, back, or muscle pain.  People around you feel you are not acting correctly or are confused.  Shortness of breath or difficulty breathing.  Dizziness and fainting.  You get a rash or develop hives.  You have a decrease in urine output.  Your urine turns a dark color or changes to pink, red, or brown. Any of the following symptoms occur over the next 10 days:  You have a temperature by mouth above 102 F (38.9 C), not controlled by medicine.  Shortness of breath.  Weakness after normal activity.  The white part of the eye turns yellow (jaundice).  You have a decrease in the amount of urine or are urinating less often.  Your urine turns a dark color or changes to pink, red, or brown. Document Released: 01/11/2000 Document Revised: 04/07/2011 Document Reviewed: 08/30/2007 [facility] Patient Information 2014 [place],  [place].  _______________________________________________________________________

## 2015-11-20 ENCOUNTER — Encounter (HOSPITAL_COMMUNITY): Payer: Self-pay

## 2015-11-20 ENCOUNTER — Encounter (HOSPITAL_COMMUNITY)
Admission: RE | Admit: 2015-11-20 | Discharge: 2015-11-20 | Disposition: A | Discharge status: Home / Self Care | Payer: Medicare Other | Source: Ambulatory Visit | Attending: Orthopedic Surgery | Admitting: Orthopedic Surgery

## 2015-11-20 DIAGNOSIS — R001 Bradycardia, unspecified: Secondary | ICD-10-CM | POA: Diagnosis not present

## 2015-11-20 DIAGNOSIS — Z01812 Encounter for preprocedural laboratory examination: Secondary | ICD-10-CM | POA: Insufficient documentation

## 2015-11-20 DIAGNOSIS — Z01810 Encounter for preprocedural cardiovascular examination: Secondary | ICD-10-CM | POA: Diagnosis not present

## 2015-11-20 DIAGNOSIS — M1990 Unspecified osteoarthritis, unspecified site: Secondary | ICD-10-CM | POA: Diagnosis not present

## 2015-11-20 DIAGNOSIS — Z87442 Personal history of urinary calculi: Secondary | ICD-10-CM | POA: Insufficient documentation

## 2015-11-20 DIAGNOSIS — K219 Gastro-esophageal reflux disease without esophagitis: Secondary | ICD-10-CM | POA: Insufficient documentation

## 2015-11-20 DIAGNOSIS — E119 Type 2 diabetes mellitus without complications: Secondary | ICD-10-CM | POA: Insufficient documentation

## 2015-11-20 DIAGNOSIS — Z87440 Personal history of urinary (tract) infections: Secondary | ICD-10-CM | POA: Diagnosis not present

## 2015-11-20 DIAGNOSIS — I10 Essential (primary) hypertension: Secondary | ICD-10-CM | POA: Insufficient documentation

## 2015-11-20 DIAGNOSIS — I252 Old myocardial infarction: Secondary | ICD-10-CM | POA: Insufficient documentation

## 2015-11-20 DIAGNOSIS — Z96652 Presence of left artificial knee joint: Secondary | ICD-10-CM | POA: Diagnosis not present

## 2015-11-20 DIAGNOSIS — R0602 Shortness of breath: Secondary | ICD-10-CM | POA: Diagnosis not present

## 2015-11-20 HISTORY — DX: Personal history of urinary calculi: Z87.442

## 2015-11-20 HISTORY — DX: Pneumonia, unspecified organism: J18.9

## 2015-11-20 LAB — BASIC METABOLIC PANEL
Anion gap: 6 (ref 5–15)
BUN: 23 mg/dL — AB (ref 6–20)
CHLORIDE: 107 mmol/L (ref 101–111)
CO2: 24 mmol/L (ref 22–32)
CREATININE: 1.63 mg/dL — AB (ref 0.61–1.24)
Calcium: 9.4 mg/dL (ref 8.9–10.3)
GFR calc Af Amer: 45 mL/min — ABNORMAL LOW (ref >60)
GFR calc non Af Amer: 39 mL/min — ABNORMAL LOW (ref >60)
GLUCOSE: 126 mg/dL — AB (ref 65–99)
Potassium: 4.6 mmol/L (ref 3.5–5.1)
SODIUM: 137 mmol/L (ref 135–145)

## 2015-11-20 LAB — CBC
HCT: 42.2 % (ref 39.0–52.0)
Hemoglobin: 14.2 g/dL (ref 13.0–17.0)
MCH: 29.7 pg (ref 26.0–34.0)
MCHC: 33.6 g/dL (ref 30.0–36.0)
MCV: 88.3 fL (ref 78.0–100.0)
PLATELETS: 163 10*3/uL (ref 150–400)
RBC: 4.78 MIL/uL (ref 4.22–5.81)
RDW: 13.4 % (ref 11.5–15.5)
WBC: 8.3 10*3/uL (ref 4.0–10.5)

## 2015-11-20 LAB — SURGICAL PCR SCREEN
MRSA, PCR: NEGATIVE
Staphylococcus aureus: NEGATIVE

## 2015-11-20 LAB — GLUCOSE, CAPILLARY: Glucose-Capillary: 196 mg/dL — ABNORMAL HIGH (ref 65–99)

## 2015-11-20 NOTE — Progress Notes (Signed)
   11/20/15 1131  OBSTRUCTIVE SLEEP APNEA  Have you ever been diagnosed with sleep apnea through a sleep study? No  Do you snore loudly (loud enough to be heard through closed doors)?  0  Do you often feel tired, fatigued, or sleepy during the daytime (such as falling asleep during driving or talking to someone)? 1  Has anyone observed you stop breathing during your sleep? 0  Do you have, or are you being treated for high blood pressure? 1  BMI more than 35 kg/m2? 0  Age > 50 (1-yes) 1  Neck circumference greater than:Male 16 inches or larger, Male 17inches or larger? 1  Male Gender (Yes=1) 1  Obstructive Sleep Apnea Score 5

## 2015-11-21 LAB — HEMOGLOBIN A1C
Hgb A1c MFr Bld: 7.5 % — ABNORMAL HIGH (ref 4.8–5.6)
Mean Plasma Glucose: 169 mg/dL

## 2015-11-27 ENCOUNTER — Encounter (HOSPITAL_COMMUNITY): Payer: Self-pay | Admitting: *Deleted

## 2015-11-27 ENCOUNTER — Inpatient Hospital Stay (HOSPITAL_COMMUNITY): Payer: Medicare Other | Admitting: Certified Registered Nurse Anesthetist

## 2015-11-27 ENCOUNTER — Encounter (HOSPITAL_COMMUNITY)
Admission: RE | Disposition: A | Discharge status: Home / Self Care | Payer: Self-pay | Source: Ambulatory Visit | Attending: Orthopedic Surgery

## 2015-11-27 ENCOUNTER — Inpatient Hospital Stay (HOSPITAL_COMMUNITY)
Admission: RE | Admit: 2015-11-27 | Discharge: 2015-11-28 | DRG: 470 | Disposition: A | Discharge status: Home / Self Care | Payer: Medicare Other | Source: Ambulatory Visit | Attending: Orthopedic Surgery | Admitting: Orthopedic Surgery

## 2015-11-27 DIAGNOSIS — K219 Gastro-esophageal reflux disease without esophagitis: Secondary | ICD-10-CM | POA: Diagnosis present

## 2015-11-27 DIAGNOSIS — Z888 Allergy status to other drugs, medicaments and biological substances status: Secondary | ICD-10-CM | POA: Diagnosis not present

## 2015-11-27 DIAGNOSIS — Z6833 Body mass index (BMI) 33.0-33.9, adult: Secondary | ICD-10-CM

## 2015-11-27 DIAGNOSIS — I252 Old myocardial infarction: Secondary | ICD-10-CM | POA: Diagnosis not present

## 2015-11-27 DIAGNOSIS — Z87891 Personal history of nicotine dependence: Secondary | ICD-10-CM

## 2015-11-27 DIAGNOSIS — Z96652 Presence of left artificial knee joint: Secondary | ICD-10-CM | POA: Diagnosis present

## 2015-11-27 DIAGNOSIS — Z96659 Presence of unspecified artificial knee joint: Secondary | ICD-10-CM

## 2015-11-27 DIAGNOSIS — E119 Type 2 diabetes mellitus without complications: Secondary | ICD-10-CM | POA: Diagnosis present

## 2015-11-27 DIAGNOSIS — R0602 Shortness of breath: Secondary | ICD-10-CM | POA: Diagnosis not present

## 2015-11-27 DIAGNOSIS — Z951 Presence of aortocoronary bypass graft: Secondary | ICD-10-CM

## 2015-11-27 DIAGNOSIS — Z885 Allergy status to narcotic agent status: Secondary | ICD-10-CM | POA: Diagnosis not present

## 2015-11-27 DIAGNOSIS — M1711 Unilateral primary osteoarthritis, right knee: Secondary | ICD-10-CM | POA: Diagnosis not present

## 2015-11-27 DIAGNOSIS — M1990 Unspecified osteoarthritis, unspecified site: Secondary | ICD-10-CM | POA: Diagnosis not present

## 2015-11-27 DIAGNOSIS — Z96651 Presence of right artificial knee joint: Secondary | ICD-10-CM

## 2015-11-27 DIAGNOSIS — E669 Obesity, unspecified: Secondary | ICD-10-CM | POA: Diagnosis present

## 2015-11-27 HISTORY — PX: TOTAL KNEE ARTHROPLASTY: SHX125

## 2015-11-27 LAB — TYPE AND SCREEN
ABO/RH(D): O POS
ANTIBODY SCREEN: NEGATIVE

## 2015-11-27 LAB — GLUCOSE, CAPILLARY
GLUCOSE-CAPILLARY: 130 mg/dL — AB (ref 65–99)
GLUCOSE-CAPILLARY: 268 mg/dL — AB (ref 65–99)
Glucose-Capillary: 94 mg/dL (ref 65–99)

## 2015-11-27 SURGERY — ARTHROPLASTY, KNEE, TOTAL
Anesthesia: Spinal | Site: Knee | Laterality: Right

## 2015-11-27 MED ORDER — DEXAMETHASONE SODIUM PHOSPHATE 10 MG/ML IJ SOLN
10.0000 mg | Freq: Once | INTRAMUSCULAR | Status: AC
  Administered 2015-11-27: 10 mg via INTRAVENOUS

## 2015-11-27 MED ORDER — MAGNESIUM CITRATE PO SOLN: 1.0000 | Freq: Once | ORAL | Status: DC | PRN

## 2015-11-27 MED ORDER — SODIUM CHLORIDE 0.9 % IJ SOLN
INTRAMUSCULAR | Status: AC
  Filled 2015-11-27: qty 50

## 2015-11-27 MED ORDER — BUPIVACAINE IN DEXTROSE 0.75-8.25 % IT SOLN
INTRATHECAL | Status: DC | PRN
  Administered 2015-11-27: 1.8 mL via INTRATHECAL

## 2015-11-27 MED ORDER — CHLORHEXIDINE GLUCONATE 4 % EX LIQD: 60.0000 mL | Freq: Once | CUTANEOUS | Status: DC

## 2015-11-27 MED ORDER — PHENOL 1.4 % MT LIQD: 1.0000 | OROMUCOSAL | Status: DC | PRN

## 2015-11-27 MED ORDER — MENTHOL 3 MG MT LOZG: 1.0000 | LOZENGE | OROMUCOSAL | Status: DC | PRN

## 2015-11-27 MED ORDER — ONDANSETRON HCL 4 MG/2ML IJ SOLN
INTRAMUSCULAR | Status: DC | PRN
  Administered 2015-11-27: 4 mg via INTRAVENOUS

## 2015-11-27 MED ORDER — METOCLOPRAMIDE HCL 5 MG/ML IJ SOLN
5.0000 mg | Freq: Three times a day (TID) | INTRAMUSCULAR | Status: DC | PRN
Start: 2015-11-27 — End: 2015-11-28

## 2015-11-27 MED ORDER — METOCLOPRAMIDE HCL 5 MG PO TABS
5.0000 mg | ORAL_TABLET | Freq: Three times a day (TID) | ORAL | Status: DC | PRN
Start: 2015-11-27 — End: 2015-11-28

## 2015-11-27 MED ORDER — METHOCARBAMOL 500 MG PO TABS
500.0000 mg | ORAL_TABLET | Freq: Four times a day (QID) | ORAL | Status: DC | PRN
  Administered 2015-11-27: 500 mg via ORAL
  Filled 2015-11-27: qty 1

## 2015-11-27 MED ORDER — LACTATED RINGERS IV SOLN
INTRAVENOUS | Status: DC
  Administered 2015-11-27 (×2): via INTRAVENOUS

## 2015-11-27 MED ORDER — 0.9 % SODIUM CHLORIDE (POUR BTL) OPTIME
TOPICAL | Status: DC | PRN
  Administered 2015-11-27: 1000 mL

## 2015-11-27 MED ORDER — ATORVASTATIN CALCIUM 20 MG PO TABS
40.0000 mg | ORAL_TABLET | Freq: Every day | ORAL | Status: DC
  Administered 2015-11-27: 40 mg via ORAL
  Filled 2015-11-27: qty 2

## 2015-11-27 MED ORDER — SODIUM CHLORIDE 0.9 % IR SOLN
Status: DC | PRN
  Administered 2015-11-27: 1000 mL

## 2015-11-27 MED ORDER — CEFAZOLIN SODIUM-DEXTROSE 2-4 GM/100ML-% IV SOLN
INTRAVENOUS | Status: AC
  Filled 2015-11-27: qty 100

## 2015-11-27 MED ORDER — PROPOFOL 10 MG/ML IV BOLUS
INTRAVENOUS | Status: AC
Start: 2015-11-27 — End: 2015-11-27
  Filled 2015-11-27: qty 20

## 2015-11-27 MED ORDER — CEFAZOLIN SODIUM-DEXTROSE 2-4 GM/100ML-% IV SOLN
2.0000 g | Freq: Four times a day (QID) | INTRAVENOUS | Status: AC
  Administered 2015-11-27 (×2): 2 g via INTRAVENOUS
  Filled 2015-11-27: qty 100

## 2015-11-27 MED ORDER — METOPROLOL TARTRATE 25 MG PO TABS
25.0000 mg | ORAL_TABLET | Freq: Two times a day (BID) | ORAL | Status: DC
  Administered 2015-11-27 – 2015-11-28 (×2): 25 mg via ORAL
  Filled 2015-11-27 (×2): qty 1

## 2015-11-27 MED ORDER — HYDROMORPHONE HCL 1 MG/ML IJ SOLN
0.5000 mg | INTRAMUSCULAR | Status: DC | PRN
  Administered 2015-11-27 – 2015-11-28 (×3): 1 mg via INTRAVENOUS
  Filled 2015-11-27 (×3): qty 1

## 2015-11-27 MED ORDER — KETOROLAC TROMETHAMINE 30 MG/ML IJ SOLN
INTRAMUSCULAR | Status: DC | PRN
  Administered 2015-11-27: 30 mg

## 2015-11-27 MED ORDER — EPHEDRINE SULFATE 50 MG/ML IJ SOLN
INTRAMUSCULAR | Status: DC | PRN
  Administered 2015-11-27 (×5): 10 mg via INTRAVENOUS

## 2015-11-27 MED ORDER — STERILE WATER FOR IRRIGATION IR SOLN
Status: DC | PRN
  Administered 2015-11-27: 2000 mL

## 2015-11-27 MED ORDER — POLYETHYLENE GLYCOL 3350 17 G PO PACK
17.0000 g | PACK | Freq: Two times a day (BID) | ORAL | Status: DC
  Administered 2015-11-27 – 2015-11-28 (×2): 17 g via ORAL
  Filled 2015-11-27 (×2): qty 1

## 2015-11-27 MED ORDER — BUPIVACAINE HCL (PF) 0.25 % IJ SOLN
INTRAMUSCULAR | Status: AC
  Filled 2015-11-27: qty 30

## 2015-11-27 MED ORDER — TAMSULOSIN HCL 0.4 MG PO CAPS
0.4000 mg | ORAL_CAPSULE | Freq: Every day | ORAL | Status: DC
  Administered 2015-11-28: 0.4 mg via ORAL
  Filled 2015-11-27: qty 1

## 2015-11-27 MED ORDER — BUPIVACAINE HCL (PF) 0.25 % IJ SOLN
INTRAMUSCULAR | Status: DC | PRN
  Administered 2015-11-27: 30 mL

## 2015-11-27 MED ORDER — ASPIRIN 81 MG PO CHEW
81.0000 mg | CHEWABLE_TABLET | Freq: Two times a day (BID) | ORAL | Status: DC
  Administered 2015-11-27 – 2015-11-28 (×2): 81 mg via ORAL
  Filled 2015-11-27 (×2): qty 1

## 2015-11-27 MED ORDER — METHOCARBAMOL 1000 MG/10ML IJ SOLN
500.0000 mg | Freq: Four times a day (QID) | INTRAVENOUS | Status: DC | PRN
  Administered 2015-11-27: 500 mg via INTRAVENOUS
  Filled 2015-11-27: qty 550
  Filled 2015-11-27: qty 5

## 2015-11-27 MED ORDER — FENTANYL CITRATE (PF) 100 MCG/2ML IJ SOLN
INTRAMUSCULAR | Status: AC
  Filled 2015-11-27: qty 2

## 2015-11-27 MED ORDER — FAMOTIDINE 20 MG PO TABS
20.0000 mg | ORAL_TABLET | Freq: Two times a day (BID) | ORAL | Status: DC
  Administered 2015-11-27 – 2015-11-28 (×2): 20 mg via ORAL
  Filled 2015-11-27 (×2): qty 1

## 2015-11-27 MED ORDER — SODIUM CHLORIDE 0.9 % IV SOLN
INTRAVENOUS | Status: DC
  Administered 2015-11-27 (×2): via INTRAVENOUS

## 2015-11-27 MED ORDER — BISACODYL 10 MG RE SUPP: 10.0000 mg | Freq: Every day | RECTAL | Status: DC | PRN

## 2015-11-27 MED ORDER — HYDROCODONE-ACETAMINOPHEN 7.5-325 MG PO TABS
1.0000 | ORAL_TABLET | ORAL | Status: DC
  Administered 2015-11-27 (×2): 1 via ORAL
  Administered 2015-11-27 – 2015-11-28 (×5): 2 via ORAL
  Filled 2015-11-27: qty 2
  Filled 2015-11-27: qty 1
  Filled 2015-11-27 (×4): qty 2
  Filled 2015-11-27: qty 1

## 2015-11-27 MED ORDER — CEFAZOLIN SODIUM-DEXTROSE 2-4 GM/100ML-% IV SOLN
2.0000 g | INTRAVENOUS | Status: AC
  Administered 2015-11-27: 2 g via INTRAVENOUS

## 2015-11-27 MED ORDER — ALUM & MAG HYDROXIDE-SIMETH 200-200-20 MG/5ML PO SUSP: 30.0000 mL | ORAL | Status: DC | PRN

## 2015-11-27 MED ORDER — INSULIN ASPART 100 UNIT/ML ~~LOC~~ SOLN
0.0000 [IU] | Freq: Three times a day (TID) | SUBCUTANEOUS | Status: DC
  Administered 2015-11-27: 2 [IU] via SUBCUTANEOUS
  Administered 2015-11-28: 3 [IU] via SUBCUTANEOUS
  Filled 2015-11-27: qty 1

## 2015-11-27 MED ORDER — ONDANSETRON HCL 4 MG/2ML IJ SOLN
INTRAMUSCULAR | Status: AC
  Filled 2015-11-27: qty 2

## 2015-11-27 MED ORDER — DEXAMETHASONE SODIUM PHOSPHATE 10 MG/ML IJ SOLN
INTRAMUSCULAR | Status: AC
  Filled 2015-11-27: qty 1

## 2015-11-27 MED ORDER — DOCUSATE SODIUM 100 MG PO CAPS
100.0000 mg | ORAL_CAPSULE | Freq: Two times a day (BID) | ORAL | Status: DC
  Administered 2015-11-27 – 2015-11-28 (×2): 100 mg via ORAL
  Filled 2015-11-27 (×2): qty 1

## 2015-11-27 MED ORDER — PROPOFOL 500 MG/50ML IV EMUL
INTRAVENOUS | Status: DC | PRN
Start: 2015-11-27 — End: 2015-11-27
  Administered 2015-11-27: 25 ug/kg/min via INTRAVENOUS

## 2015-11-27 MED ORDER — FENTANYL CITRATE (PF) 100 MCG/2ML IJ SOLN: 25.0000 ug | INTRAMUSCULAR | Status: DC | PRN

## 2015-11-27 MED ORDER — TRANEXAMIC ACID 1000 MG/10ML IV SOLN
1000.0000 mg | INTRAVENOUS | Status: AC
  Administered 2015-11-27: 1000 mg via INTRAVENOUS
  Filled 2015-11-27: qty 1100

## 2015-11-27 MED ORDER — FERROUS SULFATE 325 (65 FE) MG PO TABS
325.0000 mg | ORAL_TABLET | Freq: Three times a day (TID) | ORAL | Status: DC
  Administered 2015-11-27 – 2015-11-28 (×2): 325 mg via ORAL
  Filled 2015-11-27 (×2): qty 1

## 2015-11-27 MED ORDER — FENTANYL CITRATE (PF) 100 MCG/2ML IJ SOLN
INTRAMUSCULAR | Status: DC | PRN
  Administered 2015-11-27 (×2): 50 ug via INTRAVENOUS

## 2015-11-27 MED ORDER — ONDANSETRON HCL 4 MG/2ML IJ SOLN: 4.0000 mg | Freq: Four times a day (QID) | INTRAMUSCULAR | Status: DC | PRN

## 2015-11-27 MED ORDER — KETOROLAC TROMETHAMINE 30 MG/ML IJ SOLN
INTRAMUSCULAR | Status: AC
  Filled 2015-11-27: qty 1

## 2015-11-27 MED ORDER — ONDANSETRON HCL 4 MG PO TABS: 4.0000 mg | ORAL_TABLET | Freq: Four times a day (QID) | ORAL | Status: DC | PRN

## 2015-11-27 MED ORDER — SODIUM CHLORIDE 0.9 % IJ SOLN
INTRAMUSCULAR | Status: DC | PRN
  Administered 2015-11-27: 29 mL

## 2015-11-27 MED ORDER — PROPOFOL 10 MG/ML IV BOLUS
INTRAVENOUS | Status: AC
  Filled 2015-11-27: qty 40

## 2015-11-27 MED ORDER — CELECOXIB 200 MG PO CAPS
200.0000 mg | ORAL_CAPSULE | Freq: Two times a day (BID) | ORAL | Status: DC
  Administered 2015-11-27: 200 mg via ORAL
  Filled 2015-11-27: qty 1

## 2015-11-27 MED ORDER — DIPHENHYDRAMINE HCL 25 MG PO CAPS: 25.0000 mg | ORAL_CAPSULE | Freq: Four times a day (QID) | ORAL | Status: DC | PRN

## 2015-11-27 MED ORDER — HYDROMORPHONE HCL 1 MG/ML IJ SOLN: 0.5000 mg | INTRAMUSCULAR | Status: DC | PRN

## 2015-11-27 MED ORDER — PROMETHAZINE HCL 25 MG/ML IJ SOLN: 6.2500 mg | INTRAMUSCULAR | Status: DC | PRN

## 2015-11-27 MED ORDER — LORATADINE 10 MG PO TABS
10.0000 mg | ORAL_TABLET | Freq: Every day | ORAL | Status: DC
  Administered 2015-11-28: 10 mg via ORAL
  Filled 2015-11-27: qty 1

## 2015-11-27 MED ORDER — PIOGLITAZONE HCL 15 MG PO TABS
15.0000 mg | ORAL_TABLET | Freq: Every day | ORAL | Status: DC
  Administered 2015-11-27: 15 mg via ORAL
  Filled 2015-11-27: qty 1

## 2015-11-27 SURGICAL SUPPLY — 49 items
ADH SKN CLS APL DERMABOND .7 (GAUZE/BANDAGES/DRESSINGS) ×1
BAG SPEC THK2 15X12 ZIP CLS (MISCELLANEOUS) ×1
BAG ZIPLOCK 12X15 (MISCELLANEOUS) ×2 IMPLANT
BANDAGE ACE 6X5 VEL STRL LF (GAUZE/BANDAGES/DRESSINGS) ×3 IMPLANT
BLADE SAW SGTL 13.0X1.19X90.0M (BLADE) ×3 IMPLANT
BONE CEMENT GENTAMICIN (Cement) ×6 IMPLANT
BOWL SMART MIX CTS (DISPOSABLE) ×3 IMPLANT
CAPT KNEE TOTAL 3 ATTUNE ×2 IMPLANT
CEMENT BONE GENTAMICIN 40 (Cement) IMPLANT
CLOTH BEACON ORANGE TIMEOUT ST (SAFETY) ×3 IMPLANT
CUFF TOURN SGL QUICK 34 (TOURNIQUET CUFF) ×3
CUFF TRNQT CYL 34X4X40X1 (TOURNIQUET CUFF) ×1 IMPLANT
DECANTER SPIKE VIAL GLASS SM (MISCELLANEOUS) ×3 IMPLANT
DERMABOND ADVANCED (GAUZE/BANDAGES/DRESSINGS) ×2
DERMABOND ADVANCED .7 DNX12 (GAUZE/BANDAGES/DRESSINGS) ×1 IMPLANT
DRAPE U-SHAPE 47X51 STRL (DRAPES) ×3 IMPLANT
DRESSING AQUACEL AG SP 3.5X10 (GAUZE/BANDAGES/DRESSINGS) ×1 IMPLANT
DRSG AQUACEL AG SP 3.5X10 (GAUZE/BANDAGES/DRESSINGS) ×3
DURAPREP 26ML APPLICATOR (WOUND CARE) ×6 IMPLANT
ELECT REM PT RETURN 9FT ADLT (ELECTROSURGICAL) ×3
ELECTRODE REM PT RTRN 9FT ADLT (ELECTROSURGICAL) ×1 IMPLANT
GLOVE BIO SURGEON STRL SZ 6.5 (GLOVE) ×1 IMPLANT
GLOVE BIO SURGEONS STRL SZ 6.5 (GLOVE) ×1
GLOVE BIOGEL M 7.0 STRL (GLOVE) ×4 IMPLANT
GLOVE BIOGEL PI IND STRL 6.5 (GLOVE) IMPLANT
GLOVE BIOGEL PI IND STRL 7.5 (GLOVE) ×5 IMPLANT
GLOVE BIOGEL PI IND STRL 8.5 (GLOVE) ×1 IMPLANT
GLOVE BIOGEL PI INDICATOR 6.5 (GLOVE) ×2
GLOVE BIOGEL PI INDICATOR 7.5 (GLOVE) ×10
GLOVE BIOGEL PI INDICATOR 8.5 (GLOVE) ×2
GLOVE ORTHO TXT STRL SZ7.5 (GLOVE) ×6 IMPLANT
GOWN STRL REUS W/TWL LRG LVL3 (GOWN DISPOSABLE) ×5 IMPLANT
GOWN STRL REUS W/TWL XL LVL3 (GOWN DISPOSABLE) ×7 IMPLANT
HANDPIECE INTERPULSE COAX TIP (DISPOSABLE) ×3
MANIFOLD NEPTUNE II (INSTRUMENTS) ×3 IMPLANT
PACK TOTAL KNEE CUSTOM (KITS) ×3 IMPLANT
POSITIONER SURGICAL ARM (MISCELLANEOUS) ×3 IMPLANT
SET HNDPC FAN SPRY TIP SCT (DISPOSABLE) ×1 IMPLANT
SET PAD KNEE POSITIONER (MISCELLANEOUS) ×3 IMPLANT
SUT MNCRL AB 4-0 PS2 18 (SUTURE) ×3 IMPLANT
SUT VIC AB 1 CT1 36 (SUTURE) ×3 IMPLANT
SUT VIC AB 2-0 CT1 27 (SUTURE) ×9
SUT VIC AB 2-0 CT1 TAPERPNT 27 (SUTURE) ×3 IMPLANT
SUT VLOC 180 0 24IN GS25 (SUTURE) ×5 IMPLANT
SYR 50ML LL SCALE MARK (SYRINGE) ×3 IMPLANT
TRAY FOLEY CATH SILVER 14FR (SET/KITS/TRAYS/PACK) ×2 IMPLANT
TRAY FOLEY W/METER SILVER 16FR (SET/KITS/TRAYS/PACK) ×1 IMPLANT
WRAP KNEE MAXI GEL POST OP (GAUZE/BANDAGES/DRESSINGS) ×3 IMPLANT
YANKAUER SUCT BULB TIP 10FT TU (MISCELLANEOUS) ×3 IMPLANT

## 2015-11-27 NOTE — Anesthesia Preprocedure Evaluation (Addendum)
Anesthesia Evaluation  Patient identified by MRN, date of birth, ID band Patient awake  General Assessment Comment:Past Medical History: Diagnosis Date . Arthritis  . Diabetes mellitus without complication  . Dysrhythmia  . GERD (gastroesophageal reflux disease)  . Myocardial infarction ([place]) 2004 . Shortness of breath dyspnea   with exertion  . 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    Pulmonary shortness of breath, pneumonia, former smoker,    Pulmonary exam normal breath sounds clear to auscultation       Cardiovascular + Past MI  Normal cardiovascular exam+ dysrhythmias  Rhythm:Regular Rate:Normal     Neuro/Psych negative neurological ROS  negative psych ROS   GI/Hepatic Neg liver ROS, GERD  ,  Endo/Other  negative endocrine ROSdiabetes  Renal/GU negative Renal ROS  negative genitourinary   Musculoskeletal  (+) Arthritis ,   Abdominal (+) + obese,   Peds negative pediatric ROS (+)  Hematology negative hematology ROS (+)   Anesthesia Other Findings   Reproductive/Obstetrics negative OB ROS                            Anesthesia Physical Anesthesia Plan  ASA: III  Anesthesia Plan: Spinal   Post-op Pain Management:    Induction: Intravenous  Airway Management Planned:   Additional Equipment:   Intra-op Plan:   Post-operative Plan: Extubation in OR  Informed Consent: I have reviewed the patients History and Physical, chart, labs and discussed the procedure including the risks, benefits and alternatives for the proposed anesthesia with the patient or authorized representative who has indicated his/her understanding and acceptance.   Dental advisory  given  Plan Discussed with: CRNA  Anesthesia Plan Comments: (Discussed risks and benefits of and differences between spinal and general. Discussed risks of spinal including headache, backache, failure, bleeding and hematoma, infection, and nerve damage. Patient consents to spinal. Questions answered. Platelet count acceptable.)       Anesthesia Quick Evaluation

## 2015-11-27 NOTE — Interval H&P Note (Signed)
History and Physical Interval Note:  11/27/2015 6:57 AM  Jeffrey Kelly  has presented today for surgery, with the diagnosis of right knee osteoarthritis  The various methods of treatment have been discussed with the patient and family. After consideration of risks, benefits and other options for treatment, the patient has consented to  Procedure(s): RIGHT TOTAL KNEE ARTHROPLASTY (Right) as a surgical intervention .  The patient's history has been reviewed, patient examined, no change in status, stable for surgery.  I have reviewed the patient's chart and labs.  Questions were answered to the patient's satisfaction.     Mauri Pole

## 2015-11-27 NOTE — Op Note (Signed)
NAME:  Jeffrey Kelly                      MEDICAL RECORD NO.:  [id number]                             FACILITY:  [facility]      PHYSICIAN:  Pietro Cassis. Alvan Dame, M.D.  DATE OF BIRTH:  11-14-1934      DATE OF PROCEDURE:  11/27/2015                                     OPERATIVE REPORT         PREOPERATIVE DIAGNOSIS:  Right knee osteoarthritis.      POSTOPERATIVE DIAGNOSIS:  Right knee osteoarthritis.      FINDINGS:  The patient was noted to have complete loss of cartilage and   bone-on-bone arthritis with associated osteophytes in the medial and patellofemoral compartments of   the knee with a significant synovitis and associated effusion.      PROCEDURE:  Right total knee replacement.      COMPONENTS USED:  DePuy Attune rotating platform posterior stabilized knee   system, a size 5 standard femur, 5 tibia, size 6 PS AOX insert, and 35 anatomic patellar   button.      SURGEON:  Pietro Cassis. Alvan Dame, M.D.      ASSISTANT:  Nehemiah Massed, PA-C.      ANESTHESIA:  Spinal.      SPECIMENS:  None.      COMPLICATION:  None.      DRAINS:  None.  EBL: <50cc      TOURNIQUET TIME:   Total Tourniquet Time Documented: Thigh (Right) - 30 minutes Total: Thigh (Right) - 30 minutes  .      The patient was stable to the recovery room.      INDICATION FOR PROCEDURE:  Jeffrey Kelly is a 81 y.o. male patient of   mine.  The patient had been seen, evaluated, and treated conservatively in the   office with medication, activity modification, and injections.  The patient had   radiographic changes of bone-on-bone arthritis with endplate sclerosis and osteophytes noted.      The patient failed conservative measures including medication, injections, and activity modification, and at this point was ready for more definitive measures.   Based on the radiographic changes and failed conservative measures, the patient   decided to proceed with total knee replacement.  Risks of infection,   DVT, component  failure, need for revision surgery, postop course, and   expectations were all   discussed and reviewed.  Consent was obtained for benefit of pain   relief.      PROCEDURE IN DETAIL:  The patient was brought to the operative theater.   Once adequate anesthesia, preoperative antibiotics, 2 gm of Ancef, 1 gm of Tranexamic Acid, and 10 mg of Decadron administered, the patient was positioned supine with the right thigh tourniquet placed.  The  right lower extremity was prepped and draped in sterile fashion.  A time-   out was performed identifying the patient, planned procedure, and   extremity.      The right lower extremity was placed in the [facility] leg holder.  The leg was   exsanguinated, tourniquet elevated to 250 mmHg.  A midline incision was   made  followed by median parapatellar arthrotomy.  Following initial   exposure, attention was first directed to the patella.  Precut   measurement was noted to be 22 mm.  I resected down to 14 mm and used a   35 anatomic patellar button to restore patellar height as well as cover the cut   surface.      The lug holes were drilled and a metal shim was placed to protect the   patella from retractors and saw blades.      At this point, attention was now directed to the femur.  The femoral   canal was opened with a drill, irrigated to try to prevent fat emboli.  An   intramedullary rod was passed at 3 degrees valgus, 9 mm of bone was   resected off the distal femur.  Following this resection, the tibia was   subluxated anteriorly.  Using the extramedullary guide, 2 mm of bone was resected off   the proximal medial tibia.  We confirmed the gap would be   stable medially and laterally with a size 5 spacer block as well as confirmed   the cut was perpendicular in the coronal plane, checking with an alignment rod.      Once this was done, I sized the femur to be a size 5 in the anterior-   posterior dimension, chose a standard component based on  medial and   lateral dimension.  The size 5 rotation block was then pinned in   position anterior referenced using the C-clamp to set rotation.  The   anterior, posterior, and  chamfer cuts were made without difficulty nor   notching making certain that I was along the anterior cortex to help   with flexion gap stability.      The final box cut was made off the lateral aspect of distal femur.      At this point, the tibia was sized to be a size 5, the size 5 tray was   then pinned in position through the medial third of the tubercle,   drilled, and keel punched.  Trial reduction was now carried with a 5 femur,  5 tibia, a size 6 PS insert, and the 35 anatomic patella botton.  The knee was brought to   extension, full extension with good flexion stability with the patella   tracking through the trochlea without application of pressure.  Given   all these findings the femoral lug holes were drilled and then the trial components removed.  Final components were   opened and cement was mixed.  The knee was irrigated with normal saline   solution and pulse lavage.  The synovial lining was   then injected with 30 cc of 0.25% Marcaine and 1 cc of Toradol plus 30 cc of NS for a   total of 61 cc.      The knee was irrigated.  Final implants were then cemented onto clean and   dried cut surfaces of bone with the knee brought to extension with a size 6 PS trial insert.      Once the cement had fully cured, the excess cement was removed   throughout the knee.  I confirmed I was satisfied with the range of   motion and stability, and the final 6 PS AOX insert was chosen.  It was   placed into the knee.      The tourniquet had been let down at 30 minutes.  No significant  hemostasis required.  The   extensor mechanism was then reapproximated using #1 Vicryl and #0 V-lock sutures with the knee   in flexion.  The   remaining wound was closed with 2-0 Vicryl and running 4-0 Monocryl.   The knee was  cleaned, dried, dressed sterilely using Dermabond and   Aquacel dressing.  The patient was then   brought to recovery room in stable condition, tolerating the procedure   well.   Please note that Physician Assistant, Nehemiah Massed, PA-C, was present for the entirety of the case, and was utilized for pre-operative positioning, peri-operative retractor management, general facilitation of the procedure.  He was also utilized for primary wound closure at the end of the case.              Pietro Cassis Alvan Dame, M.D.    11/27/2015 9:51 AM

## 2015-11-27 NOTE — Anesthesia Postprocedure Evaluation (Signed)
Anesthesia Post Note  Patient: Jeffrey Kelly  Procedure(s) Performed: Procedure(s) (LRB): RIGHT TOTAL KNEE ARTHROPLASTY (Right)  Patient location during evaluation: PACU Anesthesia Type: Spinal Level of consciousness: oriented and awake and alert Pain management: pain level controlled Vital Signs Assessment: post-procedure vital signs reviewed and stable Respiratory status: spontaneous breathing, respiratory function stable and patient connected to nasal cannula oxygen Cardiovascular status: blood pressure returned to baseline and stable Postop Assessment: no headache and no backache Anesthetic complications: no    Last Vitals:  Vitals:   11/27/15 1115 11/27/15 1135  BP: 127/61 (!) 124/53  Pulse: (!) 52 (!) 54  Resp: 14 15  Temp: 36.3 C 36.6 C    Last Pain:  Vitals:   11/27/15 1030  TempSrc:   PainSc: 0-No pain                 Aquita Simmering J

## 2015-11-27 NOTE — Transfer of Care (Signed)
Immediate Anesthesia Transfer of Care Note  Patient: Jeffrey Kelly  Procedure(s) Performed: Procedure(s): RIGHT TOTAL KNEE ARTHROPLASTY (Right)  Patient Location: PACU  Anesthesia Type:MAC and Spinal  Level of Consciousness:  sedated, patient cooperative and responds to stimulation  Airway & Oxygen Therapy:Patient Spontanous Breathing and Patient connected to face mask oxgen  Post-op Assessment:  Report given to PACU RN and Post -op Vital signs reviewed and stable  Post vital signs:  Reviewed and stable  Last Vitals:  Vitals:   11/27/15 0608  BP: 119/62  Pulse: (!) 57  Resp: 16  Temp: 123XX123 C    Complications: No apparent anesthesia complications

## 2015-11-27 NOTE — Anesthesia Procedure Notes (Signed)
Procedure Name: MAC Date/Time: 11/27/2015 8:18 AM Performed by: West Pugh Pre-anesthesia Checklist: Patient identified, Timeout performed, Emergency Drugs available, Suction available and Patient being monitored Patient Re-evaluated:Patient Re-evaluated prior to inductionOxygen Delivery Method: Simple face mask Preoxygenation: Pre-oxygenation with 100% oxygen Placement Confirmation: positive ETCO2 Dental Injury: Teeth and Oropharynx as per pre-operative assessment

## 2015-11-27 NOTE — Anesthesia Procedure Notes (Signed)
Spinal  Patient location during procedure: OR Start time: 11/27/2015 8:18 AM End time: 11/27/2015 8:28 AM Reason for block: at surgeon's request Staffing Anesthesiologist: Franne Grip Resident/CRNA: West Pugh Performed: resident/CRNA  Preanesthetic Checklist Completed: patient identified, site marked, surgical consent, pre-op evaluation, timeout performed, IV checked, risks and benefits discussed and monitors and equipment checked Spinal Block Patient position: sitting Prep: ChloraPrep and site prepped and draped Patient monitoring: heart rate, continuous pulse ox and blood pressure Approach: right paramedian Location: L3-4 Injection technique: single-shot Needle Needle type: Spinocan  Needle gauge: 22 G Needle length: 9 cm Assessment Sensory level: T6 Additional Notes Expiration of kit checked and confirmed. Patient tolerated procedure well,without complications with noted clear CSF. Loss of motor and sensory on exam post injection.

## 2015-11-27 NOTE — Evaluation (Signed)
Physical Therapy Evaluation Patient Details Name: Jeffrey Kelly MRN: [id number] DOB: 1934-12-03 Today's Date: 11/27/2015   History of Present Illness  R tka  Clinical Impression  The patient ambulated x 25' today . Plans DC home with OP PT. Pt admitted with above diagnosis. Pt currently with functional limitations due to the deficits listed below (see PT Problem List). Pt will benefit from skilled PT to increase their independence and safety with mobility to allow discharge to the venue listed below.       Follow Up Recommendations Outpatient PT    Equipment Recommendations  None recommended by PT    Recommendations for Other Services       Precautions / Restrictions Precautions Precautions: Knee;Fall      Mobility  Bed Mobility Overal bed mobility: Needs Assistance Bed Mobility: Supine to Sit     Supine to sit: Min assist     General bed mobility comments: support right leg  Transfers Overall transfer level: Needs assistance Equipment used: Rolling walker (2 wheeled) Transfers: Sit to/from Stand Sit to Stand: Min assist         General transfer comment: cues for ahdna nd right leg  Ambulation/Gait Ambulation/Gait assistance: Min assist Ambulation Distance (Feet): 25 Feet Assistive device: Rolling walker (2 wheeled) Gait Pattern/deviations: Step-to pattern     General Gait Details: cues for sequence  Stairs            Wheelchair Mobility    Modified Rankin (Stroke Patients Only)       Balance                                             Pertinent Vitals/Pain Pain Assessment: 0-10 Pain Score: 5  Pain Location: R knee Pain Descriptors / Indicators: Aching Pain Intervention(s): Limited activity within patient's tolerance;Monitored during session;Premedicated before session;Ice applied    Home Living Family/patient expects to be discharged to:: Private residence Living Arrangements: Spouse/significant other Available  Help at Discharge: Family Type of Home: House Home Access: Ramped entrance     [place]: [place]: Environmental consultant - 2 wheels      Prior Function Level of Independence: Independent               Hand Dominance        Extremity/Trunk Assessment   Upper Extremity Assessment: Overall WFL for tasks assessed           Lower Extremity Assessment: RLE deficits/detail RLE Deficits / Details: + SLR, Knee flesion 5-90       Communication   Communication: No difficulties;HOH  Cognition Arousal/Alertness: Awake/alert Behavior During Therapy: WFL for tasks assessed/performed Overall Cognitive Status: Within Functional Limits for tasks assessed                      General Comments      Exercises     Assessment/Plan    PT Assessment Patient needs continued PT services  PT Problem List Decreased strength;Decreased range of motion;Decreased activity tolerance;Decreased safety awareness;Decreased knowledge of precautions;Pain          PT Treatment Interventions DME instruction;Gait training;Functional mobility training;Therapeutic activities;Patient/family education;Therapeutic exercise    PT Goals (Current goals can be found in the Care Plan section)  Acute Rehab PT Goals Patient Stated Goal: to go home PT Goal Formulation: With patient/family Time For Goal Achievement: 11/30/15  Potential to Achieve Goals: Good    Frequency 7X/week   Barriers to discharge        Co-evaluation               End of Session   Activity Tolerance: Patient tolerated treatment well Patient left: in chair;with call bell/phone within reach;with chair alarm set Nurse Communication: Mobility status         Time: [id number] PT Time Calculation (min) (ACUTE ONLY): 15 min   Charges:   PT Evaluation $PT Eval Low Complexity: 1 Procedure     PT G CodesClaretha Cooper 11/27/2015, 5:44 PM

## 2015-11-28 DIAGNOSIS — E669 Obesity, unspecified: Secondary | ICD-10-CM | POA: Diagnosis present

## 2015-11-28 LAB — CBC
HCT: 37.3 % — ABNORMAL LOW (ref 39.0–52.0)
Hemoglobin: 12.5 g/dL — ABNORMAL LOW (ref 13.0–17.0)
MCH: 30.4 pg (ref 26.0–34.0)
MCHC: 33.5 g/dL (ref 30.0–36.0)
MCV: 90.8 fL (ref 78.0–100.0)
PLATELETS: 161 10*3/uL (ref 150–400)
RBC: 4.11 MIL/uL — ABNORMAL LOW (ref 4.22–5.81)
RDW: 13.6 % (ref 11.5–15.5)
WBC: 15 10*3/uL — ABNORMAL HIGH (ref 4.0–10.5)

## 2015-11-28 LAB — BASIC METABOLIC PANEL
Anion gap: 7 (ref 5–15)
BUN: 37 mg/dL — AB (ref 6–20)
CHLORIDE: 103 mmol/L (ref 101–111)
CO2: 22 mmol/L (ref 22–32)
CREATININE: 2.02 mg/dL — AB (ref 0.61–1.24)
Calcium: 8.7 mg/dL — ABNORMAL LOW (ref 8.9–10.3)
GFR calc Af Amer: 35 mL/min — ABNORMAL LOW (ref >60)
GFR, EST NON AFRICAN AMERICAN: 30 mL/min — AB (ref >60)
Glucose, Bld: 238 mg/dL — ABNORMAL HIGH (ref 65–99)
Potassium: 5.6 mmol/L — ABNORMAL HIGH (ref 3.5–5.1)
SODIUM: 132 mmol/L — AB (ref 135–145)

## 2015-11-28 LAB — GLUCOSE, CAPILLARY: Glucose-Capillary: 185 mg/dL — ABNORMAL HIGH (ref 65–99)

## 2015-11-28 MED ORDER — ASPIRIN 81 MG PO CHEW: 81.0000 mg | CHEWABLE_TABLET | Freq: Two times a day (BID) | ORAL | 0 refills | Status: AC

## 2015-11-28 MED ORDER — DOCUSATE SODIUM 100 MG PO CAPS: 100.0000 mg | ORAL_CAPSULE | Freq: Two times a day (BID) | ORAL | 0 refills | Status: DC

## 2015-11-28 MED ORDER — POLYETHYLENE GLYCOL 3350 17 G PO PACK: 17.0000 g | PACK | Freq: Two times a day (BID) | ORAL | 0 refills | Status: AC

## 2015-11-28 MED ORDER — FERROUS SULFATE 325 (65 FE) MG PO TABS: 325.0000 mg | ORAL_TABLET | Freq: Three times a day (TID) | ORAL | 3 refills | Status: DC

## 2015-11-28 MED ORDER — METHOCARBAMOL 500 MG PO TABS: 500.0000 mg | ORAL_TABLET | Freq: Four times a day (QID) | ORAL | 0 refills | Status: AC | PRN

## 2015-11-28 MED ORDER — HYDROCODONE-ACETAMINOPHEN 7.5-325 MG PO TABS: 1.0000 | ORAL_TABLET | ORAL | 0 refills | Status: DC | PRN

## 2015-11-28 NOTE — Care Management Note (Signed)
Case Management Note  Patient Details  Name: Jeffrey Kelly MRN: [id number] Date of Birth: [birth year]  Subjective/Objective:                  RIGHT TOTAL KNEE ARTHROPLASTY (Right)  Action/Plan: Discharge planning Expected Discharge Date:                  Expected Discharge Plan:  Home/Self Care  In-House Referral:     Discharge planning Services  CM Consult  Post Acute Care Choice:  NA Choice offered to:  Patient  DME Arranged:  N/A DME Agency:  NA  HH Arranged:  NA HH Agency:  NA  Status of Service:     If discussed at [place] of Stay Meetings, dates discussed:    Additional Comments: CM met with pt in room to confirm plan is outpt PT; pt confirmed.  Pt states he has all DMe needed at home.  No other CM needs were communicated. Dellie Catholic, RN 11/28/2015, 10:02 AM

## 2015-11-28 NOTE — Evaluation (Signed)
   Occupational Therapy Evaluation Patient Details Name: Jeffrey Kelly MRN: [id number] DOB: Oct 10, 1934 Today's Date: 12-27-15    History of Present Illness R tka   Clinical Impression    OT education complete. Pt has needed DME and caregiver will A as needed             Precautions / Restrictions Precautions Precautions: None Restrictions Weight Bearing Restrictions: Yes RLE Weight Bearing: Weight bearing as tolerated      Mobility Bed Mobility Overal bed mobility: Needs Assistance Bed Mobility: Supine to Sit     Supine to sit: Min assist     General bed mobility comments: support right leg  Transfers Overall transfer level: Needs assistance Equipment used: Rolling walker (2 wheeled) Transfers: Sit to/from [facility] Sit to Stand: Supervision Stand pivot transfers: Supervision                 ADL Overall ADL's : Needs assistance/impaired                 Upper Body Dressing : Set up;Sitting   Lower Body Dressing: Minimal assistance;Sit to/from stand;Cueing for safety;Cueing for sequencing   Toilet Transfer: RW;Ambulation;Comfort height toilet;Supervision/safety   Toileting- Clothing Manipulation and Hygiene: Supervision/safety;Sit to/from stand;Cueing for safety;Cueing for sequencing   Tub/ Shower Transfer: Tub Product manager Details (indicate cue type and reason): verbalized safety Functional mobility during ADLs: Min guard General ADL Comments: friend will A as needed.                 Pertinent Vitals/Pain Pain Score: 4  Pain Location: r knee Pain Descriptors / Indicators: Aching Pain Intervention(s): Monitored during session;Ice applied     Hand Dominance     Extremity/Trunk Assessment Upper Extremity Assessment Upper Extremity Assessment: Overall WFL for tasks assessed           Communication Communication Communication: No difficulties;HOH   Cognition Arousal/Alertness:  Awake/alert Behavior During Therapy: WFL for tasks assessed/performed Overall Cognitive Status: Within Functional Limits for tasks assessed                                Home Living Family/patient expects to be discharged to:: Private residence Living Arrangements: Spouse/significant other Available Help at Discharge: Family Type of Home: House Home Access: Ramped entrance     Home Layout: One level     Bathroom Shower/Tub: Teacher, early years/pre: Handicapped height     Home Equipment: Environmental consultant - 2 wheels          Prior Functioning/Environment Level of Independence: Independent                       OT Goals(Current goals can be found in the care plan section) Acute Rehab OT Goals Patient Stated Goal: to go home OT Goal Formulation: With patient  OT Frequency:                    Time: [id number] OT Time Calculation (min): 17 min Charges:  OT General Charges $OT Visit: 1 Procedure OT Evaluation $OT Eval Moderate Complexity: 1 Procedure G-Codes:    Payton Mccallum D 12/27/15, 12:36 PM

## 2015-11-28 NOTE — Progress Notes (Signed)
Physical Therapy Treatment Patient Details Name: Jeffrey Kelly MRN: [id number] DOB: Jul 08, 1934 Today's Date: 11/28/2015    History of Present Illness R tka    PT Comments    The patient is progressing well ready for DC.  Follow Up Recommendations  Outpatient PT     Equipment Recommendations  None recommended by PT    Recommendations for Other Services       Precautions / Restrictions Precautions Precautions: None Restrictions Weight Bearing Restrictions: Yes RLE Weight Bearing: Weight bearing as tolerated    Mobility  Bed Mobility Overal bed mobility: Needs Assistance Bed Mobility: Supine to Sit;Sit to Supine     Supine to sit: Min guard Sit to supine: Min guard   General bed mobility comments: support right leg  Transfers Overall transfer level: Needs assistance Equipment used: Rolling walker (2 wheeled) Transfers: Sit to/from Stand Sit to Stand: Supervision Stand pivot transfers: Supervision          Ambulation/Gait Ambulation/Gait assistance: Min guard Ambulation Distance (Feet): 90 Feet Assistive device: Rolling walker (2 wheeled) Gait Pattern/deviations: Step-to pattern;Step-through pattern     General Gait Details: cues for sequence   Stairs            Wheelchair Mobility    Modified Rankin (Stroke Patients Only)       Balance                                    Cognition Arousal/Alertness: Awake/alert Behavior During Therapy: WFL for tasks assessed/performed Overall Cognitive Status: Within Functional Limits for tasks assessed                      Exercises Total Joint Exercises Ankle Circles/Pumps: AROM;Both;10 reps Quad Sets: AROM;Both;10 reps Towel Squeeze: AROM;Right;10 reps Short Arc Quad: AROM;Right;10 reps Heel Slides: AAROM;Right;10 reps Hip ABduction/ADduction: AAROM;Right;10 reps Straight Leg Raises: AAROM;Right;10 reps Long Arc Quad: AROM;Right;10 reps Knee Flexion: AROM;10  reps Goniometric ROM: 0-90 right    General Comments        Pertinent Vitals/Pain Pain Score: 5  Pain Location: Right knee Pain Descriptors / Indicators: Aching Pain Intervention(s): Monitored during session;Premedicated before session    Home Living Family/patient expects to be discharged to:: Private residence Living Arrangements: Spouse/significant other Available Help at Discharge: Family Type of Home: House Home Access: Ramped entrance   Home Layout: One level Home Equipment: Environmental consultant - 2 wheels      Prior Function Level of Independence: Independent          PT Goals (current goals can now be found in the care plan section) Acute Rehab PT Goals Patient Stated Goal: to go home Progress towards PT goals: Progressing toward goals    Frequency    7X/week      PT Plan Current plan remains appropriate    Co-evaluation             End of Session   Activity Tolerance: Patient tolerated treatment well Patient left:  (in bathroom)     Time: 1005-1040 PT Time Calculation (min) (ACUTE ONLY): 35 min  Charges:  $Gait Training: 8-22 mins $Therapeutic Exercise: 8-22 mins                    G Codes:      Claretha Cooper 11/28/2015, 3:29 PM

## 2015-11-28 NOTE — Discharge Instructions (Signed)

## 2015-11-28 NOTE — Progress Notes (Signed)
     Subjective: 1 Day Post-Op Procedure(s) (LRB): RIGHT TOTAL KNEE ARTHROPLASTY (Right)   Patient reports pain as mild, Norco is controlling the pain well.  No reported events throughout the night. Patient feels that his is ready to be discharged home.   Objective:   VITALS:   Vitals:   11/28/15 0141 11/28/15 0557  BP: 125/62 (!) 106/54  Pulse: 68 86  Resp: 16 18  Temp: 97.8 F (36.6 C) 97.5 F (36.4 C)    Dorsiflexion/Plantar flexion intact Incision: dressing C/D/I No cellulitis present Compartment soft  LABS  Recent Labs  11/28/15 0437  HGB 12.5*  HCT 37.3*  WBC 15.0*  PLT 161     Recent Labs  11/28/15 0437  NA 132*  K 5.6*  BUN 37*  CREATININE 2.02*  GLUCOSE 238*     Assessment/Plan: 1 Day Post-Op Procedure(s) (LRB): RIGHT TOTAL KNEE ARTHROPLASTY (Right) Foley cath d/c'ed Advance diet Up with therapy D/C IV fluids Discharge home Follow up in 2 weeks at [facility]. Follow up with OLIN,Dashanae Longfield D in 2 weeks.  Contact information:  [facility] [address] [place] [id number]    Obese (BMI 30-39.9) Estimated body mass index is 33.13 kg/m as calculated from the following:   Height as of this encounter: 5\' 3"  (1.6 m).   Weight as of this encounter: 84.8 kg (187 lb). Patient also counseled that weight may inhibit the healing process Patient counseled that losing weight will help with future health issues         West Pugh. Tadd Holtmeyer   PAC  11/28/2015, 8:58 AM

## 2015-11-30 ENCOUNTER — Encounter (HOSPITAL_COMMUNITY): Payer: Self-pay

## 2015-11-30 ENCOUNTER — Emergency Department (HOSPITAL_COMMUNITY)
Admission: EM | Admit: 2015-11-30 | Discharge: 2015-12-01 | Disposition: A | Discharge status: Home / Self Care | Payer: Medicare Other | Attending: Emergency Medicine | Admitting: Emergency Medicine

## 2015-11-30 DIAGNOSIS — E119 Type 2 diabetes mellitus without complications: Secondary | ICD-10-CM | POA: Insufficient documentation

## 2015-11-30 DIAGNOSIS — Z96651 Presence of right artificial knee joint: Secondary | ICD-10-CM | POA: Diagnosis not present

## 2015-11-30 DIAGNOSIS — Z951 Presence of aortocoronary bypass graft: Secondary | ICD-10-CM | POA: Diagnosis not present

## 2015-11-30 DIAGNOSIS — T50905A Adverse effect of unspecified drugs, medicaments and biological substances, initial encounter: Secondary | ICD-10-CM

## 2015-11-30 DIAGNOSIS — T887XXA Unspecified adverse effect of drug or medicament, initial encounter: Secondary | ICD-10-CM | POA: Insufficient documentation

## 2015-11-30 DIAGNOSIS — Z96653 Presence of artificial knee joint, bilateral: Secondary | ICD-10-CM | POA: Diagnosis not present

## 2015-11-30 DIAGNOSIS — T50995A Adverse effect of other drugs, medicaments and biological substances, initial encounter: Secondary | ICD-10-CM | POA: Diagnosis not present

## 2015-11-30 DIAGNOSIS — Z79899 Other long term (current) drug therapy: Secondary | ICD-10-CM | POA: Diagnosis not present

## 2015-11-30 DIAGNOSIS — M25561 Pain in right knee: Secondary | ICD-10-CM | POA: Diagnosis not present

## 2015-11-30 DIAGNOSIS — R400 Somnolence: Secondary | ICD-10-CM

## 2015-11-30 DIAGNOSIS — I252 Old myocardial infarction: Secondary | ICD-10-CM | POA: Insufficient documentation

## 2015-11-30 DIAGNOSIS — Z7982 Long term (current) use of aspirin: Secondary | ICD-10-CM | POA: Insufficient documentation

## 2015-11-30 DIAGNOSIS — M62551 Muscle wasting and atrophy, not elsewhere classified, right thigh: Secondary | ICD-10-CM | POA: Diagnosis not present

## 2015-11-30 DIAGNOSIS — R2689 Other abnormalities of gait and mobility: Secondary | ICD-10-CM | POA: Diagnosis not present

## 2015-11-30 DIAGNOSIS — N289 Disorder of kidney and ureter, unspecified: Secondary | ICD-10-CM | POA: Diagnosis not present

## 2015-11-30 DIAGNOSIS — R110 Nausea: Secondary | ICD-10-CM

## 2015-11-30 DIAGNOSIS — Z87891 Personal history of nicotine dependence: Secondary | ICD-10-CM | POA: Insufficient documentation

## 2015-11-30 DIAGNOSIS — D649 Anemia, unspecified: Secondary | ICD-10-CM

## 2015-11-30 DIAGNOSIS — Y658 Other specified misadventures during surgical and medical care: Secondary | ICD-10-CM | POA: Insufficient documentation

## 2015-11-30 DIAGNOSIS — Z794 Long term (current) use of insulin: Secondary | ICD-10-CM | POA: Diagnosis not present

## 2015-11-30 DIAGNOSIS — T402X5A Adverse effect of other opioids, initial encounter: Secondary | ICD-10-CM | POA: Diagnosis not present

## 2015-11-30 DIAGNOSIS — R112 Nausea with vomiting, unspecified: Secondary | ICD-10-CM | POA: Diagnosis not present

## 2015-11-30 LAB — COMPREHENSIVE METABOLIC PANEL
ALK PHOS: 49 U/L (ref 38–126)
ALT: 45 U/L (ref 17–63)
ANION GAP: 8 (ref 5–15)
AST: 65 U/L — ABNORMAL HIGH (ref 15–41)
Albumin: 4.3 g/dL (ref 3.5–5.0)
BILIRUBIN TOTAL: 1.4 mg/dL — AB (ref 0.3–1.2)
BUN: 39 mg/dL — ABNORMAL HIGH (ref 6–20)
CALCIUM: 9.4 mg/dL (ref 8.9–10.3)
CO2: 22 mmol/L (ref 22–32)
Chloride: 105 mmol/L (ref 101–111)
Creatinine, Ser: 1.62 mg/dL — ABNORMAL HIGH (ref 0.61–1.24)
GFR calc non Af Amer: 39 mL/min — ABNORMAL LOW (ref >60)
GFR, EST AFRICAN AMERICAN: 45 mL/min — AB (ref >60)
Glucose, Bld: 102 mg/dL — ABNORMAL HIGH (ref 65–99)
Potassium: 4.9 mmol/L (ref 3.5–5.1)
SODIUM: 135 mmol/L (ref 135–145)
TOTAL PROTEIN: 7.4 g/dL (ref 6.5–8.1)

## 2015-11-30 LAB — CBC WITH DIFFERENTIAL/PLATELET
BASOS ABS: 0 10*3/uL (ref 0.0–0.1)
BASOS PCT: 0 %
Eosinophils Absolute: 0 10*3/uL (ref 0.0–0.7)
Eosinophils Relative: 0 %
HEMATOCRIT: 36.2 % — AB (ref 39.0–52.0)
HEMOGLOBIN: 12.3 g/dL — AB (ref 13.0–17.0)
Lymphocytes Relative: 13 %
Lymphs Abs: 1.4 10*3/uL (ref 0.7–4.0)
MCH: 30.6 pg (ref 26.0–34.0)
MCHC: 34 g/dL (ref 30.0–36.0)
MCV: 90 fL (ref 78.0–100.0)
Monocytes Absolute: 1.6 10*3/uL — ABNORMAL HIGH (ref 0.1–1.0)
Monocytes Relative: 15 %
NEUTROS ABS: 7.9 10*3/uL — AB (ref 1.7–7.7)
NEUTROS PCT: 72 %
Platelets: 167 10*3/uL (ref 150–400)
RBC: 4.02 MIL/uL — AB (ref 4.22–5.81)
RDW: 13.8 % (ref 11.5–15.5)
WBC: 10.9 10*3/uL — ABNORMAL HIGH (ref 4.0–10.5)

## 2015-11-30 LAB — URINALYSIS, ROUTINE W REFLEX MICROSCOPIC
Bilirubin Urine: NEGATIVE
Glucose, UA: NEGATIVE mg/dL
Ketones, ur: NEGATIVE mg/dL
LEUKOCYTES UA: NEGATIVE
Nitrite: NEGATIVE
PROTEIN: NEGATIVE mg/dL
Specific Gravity, Urine: 1.015 (ref 1.005–1.030)
pH: 6 (ref 5.0–8.0)

## 2015-11-30 LAB — URINE MICROSCOPIC-ADD ON: BACTERIA UA: NONE SEEN

## 2015-11-30 LAB — TROPONIN I

## 2015-11-30 LAB — ACETAMINOPHEN LEVEL: Acetaminophen (Tylenol), Serum: <10 ug/mL — ABNORMAL LOW (ref 10–30)

## 2015-11-30 MED ORDER — ONDANSETRON HCL 4 MG/2ML IJ SOLN
4.0000 mg | Freq: Once | INTRAMUSCULAR | Status: AC
  Administered 2015-11-30: 4 mg via INTRAVENOUS
  Filled 2015-11-30: qty 2

## 2015-11-30 MED ORDER — SODIUM CHLORIDE 0.9 % IV BOLUS (SEPSIS)
1000.0000 mL | Freq: Once | INTRAVENOUS | Status: AC
  Administered 2015-11-30: 1000 mL via INTRAVENOUS

## 2015-11-30 NOTE — ED Notes (Signed)
Provider in room  

## 2015-11-30 NOTE — ED Triage Notes (Addendum)
Pt states that he had a R knee replacement on Tuesday. Pt now c/o N/V and confusion. He also states that he is starting to feel weak. Pt is afraid that he may have accidentally over medicated with medications that he has been given. A&Ox4. Ambulatory with assistance. Pt very anxious at bedside.

## 2015-11-30 NOTE — ED Provider Notes (Signed)
[facility] DEPT Provider Note   CSN: [id number] Arrival date & time: 11/30/15  2142     History   Chief Complaint Chief Complaint  Patient presents with  . Emesis  . Weakness    HPI Jeffrey Kelly is a 81 y.o. male.  He is 3 days status post right total knee replacement, and was discharged 2 days ago. When he got home, he started having problems with nausea and vomiting. Family has also noticed that he is lethargic and falling asleep. He is having difficulty holding things in his hands. He has been taking hydrocodone-acetaminophen for pain, methocarbamol, and meclizine. These are all new medications for him. There has not been any fever or chills.   The history is provided by the patient, a relative and the spouse.  Emesis    Weakness  Associated symptoms include vomiting.    Past Medical History:  Diagnosis Date  . Arthritis   . Diabetes mellitus without complication ([place])   . Dysrhythmia   . GERD (gastroesophageal reflux disease)   . History of kidney stones   . Myocardial infarction 2004  . Pneumonia   . Shortness of breath dyspnea    with exertion   . Urinary tract infection    hx of hopsitalization 10/2013     Patient Active Problem List   Diagnosis Date Noted  . Obese 11/28/2015  . S/P right TKA 11/27/2015  . S/P left TKA 10/17/2014    Past Surgical History:  Procedure Laterality Date  . ABDOMINAL ANGIOGRAM  1994  . ABDOMINAL AORTIC ANEURYSM REPAIR  1996   . BACK SURGERY  2002   2008 and 2002   . CHOLECYSTECTOMY  2004  . COLONOSCOPY  2012   . CORONARY ANGIOPLASTY  1993  . CORONARY ARTERY BYPASS GRAFT  2004  . excision of scrotal lipoma     . EYE SURGERY Bilateral    bilateral extraction with iol  . HERNIA REPAIR     02/01/2014    . left ear surgery   1980   x 2   . left hydrocelectomy   01/2014   . left knee scope   2007   . left spermatocelectomy   01/2014   . LITHOTRIPSY  08/21/2014   . right knee torn cartilage surgery   1970  . scrotal  cyst removed   1999  . TOTAL KNEE ARTHROPLASTY Left 10/17/2014   Procedure: LEFT TOTAL KNEE ARTHROPLASTY;  Surgeon: Paralee Cancel, MD;  Location: [facility] ORS;  Service: Orthopedics;  Laterality: Left;  . TOTAL KNEE ARTHROPLASTY Right 11/27/2015   Procedure: RIGHT TOTAL KNEE ARTHROPLASTY;  Surgeon: Paralee Cancel, MD;  Location: [facility] ORS;  Service: Orthopedics;  Laterality: Right;  . trigger finer release   2011   . VASECTOMY  1999       Home Medications    Prior to Admission medications   Medication Sig Start Date End Date Taking? Authorizing Provider  aspirin 81 MG chewable tablet Chew 1 tablet (81 mg total) by mouth 2 (two) times daily. Take for 4 weeks, then resume regular dose. 11/28/15 12/28/15  Danae Orleans, PA-C  atorvastatin (LIPITOR) 80 MG tablet Take 40 mg by mouth at bedtime.    Historical Provider, MD  B Complex-C (SUPER B COMPLEX PO) Take 1 tablet by mouth daily.    Historical Provider, MD  cetirizine (ZYRTEC) 10 MG tablet Take 10 mg by mouth daily.    Historical Provider, MD  docusate sodium (COLACE) 100 MG capsule Take 1  capsule (100 mg total) by mouth 2 (two) times daily. 11/28/15   Danae Orleans, PA-C  ferrous sulfate 325 (65 FE) MG tablet Take 1 tablet (325 mg total) by mouth 3 (three) times daily after meals. 11/28/15   Danae Orleans, PA-C  HYDROcodone-acetaminophen (NORCO) 7.5-325 MG tablet Take 1-2 tablets by mouth every 4 (four) hours as needed for moderate pain. 11/28/15   Danae Orleans, PA-C  Hypromellose (ISOPTO TEARS OP) Apply 1 drop to eye daily as needed (dry eyes).    Historical Provider, MD  insulin NPH-regular Human (NOVOLIN 70/30) (70-30) 100 UNIT/ML injection Inject 50-60 Units into the skin 2 (two) times daily with a meal. TAKES 50 UNITS IN THE MORNING AND 60 AT NIGHT    Historical Provider, MD  lisinopril (PRINIVIL,ZESTRIL) 10 MG tablet Take 5 mg by mouth daily.    Historical Provider, MD  methocarbamol (ROBAXIN) 500 MG tablet Take 1 tablet (500 mg total) by mouth  every 6 (six) hours as needed for muscle spasms. 11/28/15   Danae Orleans, PA-C  metoprolol tartrate (LOPRESSOR) 25 MG tablet Take 25 mg by mouth 2 (two) times daily.    Historical Provider, MD  pioglitazone (ACTOS) 15 MG tablet Take 15 mg by mouth daily.    Historical Provider, MD  polyethylene glycol (MIRALAX / GLYCOLAX) packet Take 17 g by mouth 2 (two) times daily. 11/28/15   Danae Orleans, PA-C  ranitidine (ZANTAC) 150 MG tablet Take 150 mg by mouth daily.    Historical Provider, MD  tamsulosin (FLOMAX) 0.4 MG CAPS capsule Take 0.4 mg by mouth daily.    Historical Provider, MD    Family History History reviewed. No pertinent family history.  Social History Social History  Substance Use Topics  . Smoking status: Former Research scientist (life sciences)  . Smokeless tobacco: Never Used  . Alcohol use No     Comment: hx of 20 years ago      Allergies   Morphine and related and Zinc oxide   Review of Systems Review of Systems  Gastrointestinal: Positive for vomiting.  Neurological: Positive for weakness.  All other systems reviewed and are negative.    Physical Exam Updated Vital Signs BP 159/71 (BP Location: Left Arm)   Pulse 62   Temp 98.5 F (36.9 C) (Oral)   Resp 18   SpO2 93%   Physical Exam  Nursing note and vitals reviewed.  81 year old male, resting comfortably and in no acute distress. Vital signs are significant for hypertension. Oxygen saturation is 93%, which is normal. Head is normocephalic and atraumatic. PERRLA, EOMI. Oropharynx is clear. Neck is nontender and supple without adenopathy or JVD. Back is nontender and there is no CVA tenderness. Lungs are clear without rales, wheezes, or rhonchi. Chest is nontender. Heart has regular rate and rhythm without murmur. Abdomen is soft, flat, nontender without masses or hepatosplenomegaly and peristalsis is normoactive. Extremities: legs have full leg wrapping which is not removed. Skin is warm and dry without rash. Neurologic: He  is drowsy but arousable, mentation appears normal when aroused, cranial nerves are intact, there are no motor or sensory deficits.  ED Treatments / Results  Labs (all labs ordered are listed, but only abnormal results are displayed) Labs Reviewed  COMPREHENSIVE METABOLIC PANEL - Abnormal; Notable for the following:       Result Value   Glucose, Bld 102 (*)    BUN 39 (*)    Creatinine, Ser 1.62 (*)    AST 65 (*)    Total  Bilirubin 1.4 (*)    GFR calc non Af Amer 39 (*)    GFR calc Af Amer 45 (*)    All other components within normal limits  CBC WITH DIFFERENTIAL/PLATELET - Abnormal; Notable for the following:    WBC 10.9 (*)    RBC 4.02 (*)    Hemoglobin 12.3 (*)    HCT 36.2 (*)    Neutro Abs 7.9 (*)    Monocytes Absolute 1.6 (*)    All other components within normal limits  ACETAMINOPHEN LEVEL - Abnormal; Notable for the following:    Acetaminophen (Tylenol), Serum <10 (*)    All other components within normal limits  URINALYSIS, ROUTINE W REFLEX MICROSCOPIC (NOT AT [facility]) - Abnormal; Notable for the following:    Hgb urine dipstick TRACE (*)    All other components within normal limits  URINE MICROSCOPIC-ADD ON - Abnormal; Notable for the following:    Squamous Epithelial / LPF 0-5 (*)    All other components within normal limits  TROPONIN I    EKG  EKG Interpretation  Date/Time:  Friday November 30 2015 22:17:25 EDT Ventricular Rate:  60 PR Interval:    QRS Duration: 87 QT Interval:  430 QTC Calculation: 430 R Axis:   8 Text Interpretation:  Sinus rhythm Normal ECG When compared with ECG of 11/20/2015, No significant change was found Confirmed by [facility]  MD, Lupe Handley ([id number]) on 11/30/2015 10:25:48 PM       Procedures Procedures (including critical care time)  Medications Ordered in ED Medications  ondansetron (ZOFRAN) injection 4 mg (4 mg Intravenous Given 11/30/15 2302)  sodium chloride 0.9 % bolus 1,000 mL (1,000 mLs Intravenous New Bag/Given 11/30/15 2302)      Initial Impression / Assessment and Plan / ED Course  I have reviewed the triage vital signs and the nursing notes.  Pertinent lab results that were available during my care of the patient were reviewed by me and considered in my medical decision making (see chart for details).  Clinical Course   Nausea which is very likely a side effect of hydrocodone. According to family, he had nausea last year when he had left knee replacement. Somnolence is likely due to the fact that he is taking several medications which can cause drowsiness. We'll check screening labs and urinalysis to look for other potential causes. Old records are reviewed confirming right knee total arthroplasty done 3 days ago.He is given a dose of ondansetron for nausea and given IV fluid replacement.  He feels considerably better following above-noted treatment. Laboratory workup shows stable anemia and stable renal insufficiency. No evidence of urinary tract infection. No evidence of hepatic injury. I discussed these findings with the patient and family. I will try to keep him on lower doses of his medications to try to minimize side effects. He is given prescriptions for meclizine 12.5 mg to replace his meclizine 25 mg, and a prescription for hydrocodone-acetaminophen 5-325 to replace his hydrocodone-acetaminophen 7.5-325. He is given a prescription for ondansetron to use for nausea instead of meclizine.  Final Clinical Impressions(s) / ED Diagnoses   Final diagnoses:  Medication side effect, initial encounter  Somnolence  Nausea  Renal insufficiency  Normochromic normocytic anemia    New Prescriptions New Prescriptions   HYDROCODONE-ACETAMINOPHEN (NORCO) 5-325 MG TABLET    Take 1 tablet by mouth every 4 (four) hours as needed for moderate pain.   MECLIZINE (ANTIVERT) 12.5 MG TABLET    Take 1 tablet (12.5 mg total) by mouth  3 (three) times daily as needed for dizziness.   ONDANSETRON (ZOFRAN) 4 MG TABLET    Take 1  tablet (4 mg total) by mouth every 6 (six) hours.     Delora Fuel, MD [id number]

## 2015-12-01 DIAGNOSIS — T887XXA Unspecified adverse effect of drug or medicament, initial encounter: Secondary | ICD-10-CM | POA: Diagnosis not present

## 2015-12-01 MED ORDER — MECLIZINE HCL 12.5 MG PO TABS: 12.5000 mg | ORAL_TABLET | Freq: Three times a day (TID) | ORAL | 0 refills | Status: DC | PRN

## 2015-12-01 MED ORDER — HYDROCODONE-ACETAMINOPHEN 5-325 MG PO TABS: 1.0000 | ORAL_TABLET | ORAL | 0 refills | Status: DC | PRN

## 2015-12-01 MED ORDER — ONDANSETRON HCL 4 MG PO TABS: 4.0000 mg | ORAL_TABLET | Freq: Four times a day (QID) | ORAL | 0 refills | Status: DC

## 2015-12-01 NOTE — Discharge Instructions (Signed)
Take ondansetron (Zofran) instead of meclizine for nausea. Reserve meclizine for vertigo (dizziness). I have prescribed a lower dose of the meclizine to minimize the amount of drowsiness it will cause.  I have prescribed a lower dose of hydrocodone-acetaminophen. Hopefully, this will lead to less nausea and less drowsiness. Use this for pain control. If it is not giving you sufficient pain control, you may occasionally take your higher dose tablets as needed.  If you need to use methocarbamol (the muscle relaxer) tried taking half of a tablet at a time rather than a full tablet. You may take a half tablet as often as every 6 hours.

## 2015-12-03 DIAGNOSIS — R2689 Other abnormalities of gait and mobility: Secondary | ICD-10-CM | POA: Diagnosis not present

## 2015-12-03 DIAGNOSIS — M62551 Muscle wasting and atrophy, not elsewhere classified, right thigh: Secondary | ICD-10-CM | POA: Diagnosis not present

## 2015-12-03 DIAGNOSIS — M25561 Pain in right knee: Secondary | ICD-10-CM | POA: Diagnosis not present

## 2015-12-03 DIAGNOSIS — Z96651 Presence of right artificial knee joint: Secondary | ICD-10-CM | POA: Diagnosis not present

## 2015-12-05 DIAGNOSIS — R2689 Other abnormalities of gait and mobility: Secondary | ICD-10-CM | POA: Diagnosis not present

## 2015-12-05 DIAGNOSIS — M25561 Pain in right knee: Secondary | ICD-10-CM | POA: Diagnosis not present

## 2015-12-05 DIAGNOSIS — M62551 Muscle wasting and atrophy, not elsewhere classified, right thigh: Secondary | ICD-10-CM | POA: Diagnosis not present

## 2015-12-05 DIAGNOSIS — Z96651 Presence of right artificial knee joint: Secondary | ICD-10-CM | POA: Diagnosis not present

## 2015-12-05 NOTE — Discharge Summary (Signed)
Physician Discharge Summary  Patient ID: Jeffrey Kelly MRN: [id number] DOB/AGE: [birth year] 81 y.o.  Admit date: 11/27/2015 Discharge date: 11/28/2015   Procedures:  Procedure(s) (LRB): RIGHT TOTAL KNEE ARTHROPLASTY (Right)  Attending Physician:  Dr. Paralee Cancel   Admission Diagnoses:   Right knee primary OA / pain  Discharge Diagnoses:  Principal Problem:   S/P right TKA Active Problems:   Obese  Past Medical History:  Diagnosis Date  . Arthritis   . Diabetes mellitus without complication ([place])   . Dysrhythmia   . GERD (gastroesophageal reflux disease)   . History of kidney stones   . Myocardial infarction 2004  . Pneumonia   . Shortness of breath dyspnea    with exertion   . Urinary tract infection    hx of hopsitalization 10/2013     HPI:    Jeffrey Kelly, 81 y.o. male, has a history of pain and functional disability in the right knee due to arthritis and has failed non-surgical conservative treatments for greater than 12 weeks to include NSAID's and/or analgesics, corticosteriod injections, viscosupplementation injections and activity modification.  Onset of symptoms was gradual, starting 1+ years ago with gradually worsening course since that time. The patient noted prior procedures on the knee to include  Menisectomy (open) on the right knee(s).  Patient currently rates pain in the right knee(s) at 8 out of 10 with activity. Patient has worsening of pain with activity and weight bearing, pain that interferes with activities of daily living, pain with passive range of motion, crepitus and joint swelling.  Patient has evidence of periarticular osteophytes and joint space narrowing by imaging studies.  There is no active infection.   Risks, benefits and expectations were discussed with the patient.  Risks including but not limited to the risk of anesthesia, blood clots, nerve damage, blood vessel damage, failure of the prosthesis, infection and up to and including death.   Patient understand the risks, benefits and expectations and wishes to proceed with surgery.  PCP: Cyndy Freeze, MD   Discharged Condition: good  Hospital Course:  Patient underwent the above stated procedure on 11/27/2015. Patient tolerated the procedure well and brought to the recovery room in good condition and subsequently to the floor.  POD #1 BP: 106/54 ; Pulse: 86 ; Temp: 97.5 F (36.4 C) ; Resp: 18 Patient reports pain as mild, Norco is controlling the pain well.  No reported events throughout the night. Patient feels that his is ready to be discharged home.  Dorsiflexion/plantar flexion intact, incision: dressing C/D/I, no cellulitis present and compartment soft.   LABS  Basename    HGB     12.5  HCT     37.3    Discharge Exam: General appearance: alert, cooperative and no distress Extremities: Homans sign is negative, no sign of DVT, no edema, redness or tenderness in the calves or thighs and no ulcers, gangrene or trophic changes  Disposition: Home with follow up in 2 weeks   Follow-up Information    Mauri Pole, MD. Schedule an appointment as soon as possible for a visit in 2 week(s).   Specialty:  Orthopedic Surgery Contact information: [address] [place] [address] [phone]           Discharge Instructions    Call MD / Call 911    Complete by:  As directed    If you experience chest pain or shortness of breath, CALL 911 and be transported to the hospital emergency room.  If you develope a fever above 101 F, pus (white drainage) or increased drainage or redness at the wound, or calf pain, call your surgeon's office.   Change dressing    Complete by:  As directed    Maintain surgical dressing until follow up in the clinic. If the edges start to pull up, may reinforce with tape. If the dressing is no longer working, may remove and cover with gauze and tape, but must keep the area dry and clean.  Call with any questions or  concerns.   Constipation Prevention    Complete by:  As directed    Drink plenty of fluids.  Prune juice may be helpful.  You may use a stool softener, such as Colace (over the counter) 100 mg twice a day.  Use MiraLax (over the counter) for constipation as needed.   Diet - low sodium heart healthy    Complete by:  As directed    Discharge instructions    Complete by:  As directed    Maintain surgical dressing until follow up in the clinic. If the edges start to pull up, may reinforce with tape. If the dressing is no longer working, may remove and cover with gauze and tape, but must keep the area dry and clean.  Follow up in 2 weeks at [facility]. Call with any questions or concerns.   Increase activity slowly as tolerated    Complete by:  As directed    Weight bearing as tolerated with assist device (walker, cane, etc) as directed, use it as long as suggested by your surgeon or therapist, typically at least 4-6 weeks.   TED hose    Complete by:  As directed    Use stockings (TED hose) for 2 weeks on both leg(s).  You may remove them at night for sleeping.        Medication List    STOP taking these medications   aspirin EC 81 MG tablet Replaced by:  aspirin 81 MG chewable tablet     TAKE these medications   aspirin 81 MG chewable tablet Chew 1 tablet (81 mg total) by mouth 2 (two) times daily. Take for 4 weeks, then resume regular dose. Replaces:  aspirin EC 81 MG tablet   atorvastatin 80 MG tablet Commonly known as:  LIPITOR Take 40 mg by mouth at bedtime.   cetirizine 10 MG tablet Commonly known as:  ZYRTEC Take 10 mg by mouth every morning.   insulin NPH-regular Human (70-30) 100 UNIT/ML injection Commonly known as:  NOVOLIN 70/30 Inject 50-60 Units into the skin 2 (two) times daily with a meal. TAKES 50 UNITS IN THE MORNING AND 60 AT NIGHT   ISOPTO TEARS OP Apply 1 drop to eye daily as needed (dry eyes).   lisinopril 10 MG tablet Commonly known as:   PRINIVIL,ZESTRIL Take 5 mg by mouth every morning.   methocarbamol 500 MG tablet Commonly known as:  ROBAXIN Take 1 tablet (500 mg total) by mouth every 6 (six) hours as needed for muscle spasms. What changed:  when to take this  reasons to take this   metoprolol tartrate 25 MG tablet Commonly known as:  LOPRESSOR Take 25 mg by mouth 2 (two) times daily.   pioglitazone 15 MG tablet Commonly known as:  ACTOS Take 15 mg by mouth every morning.   polyethylene glycol packet Commonly known as:  MIRALAX / GLYCOLAX Take 17 g by mouth 2 (two) times daily. What changed:  when to  take this  reasons to take this   ranitidine 150 MG tablet Commonly known as:  ZANTAC Take 150 mg by mouth every morning.   SUPER B COMPLEX PO Take 1 tablet by mouth every morning.   tamsulosin 0.4 MG Caps capsule Commonly known as:  FLOMAX Take 0.4 mg by mouth daily after breakfast.        Signed: [place] Pugh. Brandon Scarbrough   PA-C  12/05/2015, 9:40 AM

## 2015-12-07 DIAGNOSIS — M62551 Muscle wasting and atrophy, not elsewhere classified, right thigh: Secondary | ICD-10-CM | POA: Diagnosis not present

## 2015-12-07 DIAGNOSIS — Z96651 Presence of right artificial knee joint: Secondary | ICD-10-CM | POA: Diagnosis not present

## 2015-12-07 DIAGNOSIS — M25561 Pain in right knee: Secondary | ICD-10-CM | POA: Diagnosis not present

## 2015-12-07 DIAGNOSIS — R2689 Other abnormalities of gait and mobility: Secondary | ICD-10-CM | POA: Diagnosis not present

## 2015-12-10 DIAGNOSIS — Z96651 Presence of right artificial knee joint: Secondary | ICD-10-CM | POA: Diagnosis not present

## 2015-12-10 DIAGNOSIS — M62551 Muscle wasting and atrophy, not elsewhere classified, right thigh: Secondary | ICD-10-CM | POA: Diagnosis not present

## 2015-12-10 DIAGNOSIS — R2689 Other abnormalities of gait and mobility: Secondary | ICD-10-CM | POA: Diagnosis not present

## 2015-12-10 DIAGNOSIS — M25561 Pain in right knee: Secondary | ICD-10-CM | POA: Diagnosis not present

## 2015-12-12 DIAGNOSIS — M62551 Muscle wasting and atrophy, not elsewhere classified, right thigh: Secondary | ICD-10-CM | POA: Diagnosis not present

## 2015-12-12 DIAGNOSIS — Z471 Aftercare following joint replacement surgery: Secondary | ICD-10-CM | POA: Diagnosis not present

## 2015-12-12 DIAGNOSIS — M25561 Pain in right knee: Secondary | ICD-10-CM | POA: Diagnosis not present

## 2015-12-12 DIAGNOSIS — R2689 Other abnormalities of gait and mobility: Secondary | ICD-10-CM | POA: Diagnosis not present

## 2015-12-12 DIAGNOSIS — Z96651 Presence of right artificial knee joint: Secondary | ICD-10-CM | POA: Diagnosis not present

## 2015-12-14 DIAGNOSIS — M25561 Pain in right knee: Secondary | ICD-10-CM | POA: Diagnosis not present

## 2015-12-14 DIAGNOSIS — M62551 Muscle wasting and atrophy, not elsewhere classified, right thigh: Secondary | ICD-10-CM | POA: Diagnosis not present

## 2015-12-14 DIAGNOSIS — Z96651 Presence of right artificial knee joint: Secondary | ICD-10-CM | POA: Diagnosis not present

## 2015-12-14 DIAGNOSIS — R2689 Other abnormalities of gait and mobility: Secondary | ICD-10-CM | POA: Diagnosis not present

## 2015-12-17 DIAGNOSIS — M62551 Muscle wasting and atrophy, not elsewhere classified, right thigh: Secondary | ICD-10-CM | POA: Diagnosis not present

## 2015-12-17 DIAGNOSIS — M25561 Pain in right knee: Secondary | ICD-10-CM | POA: Diagnosis not present

## 2015-12-17 DIAGNOSIS — Z96651 Presence of right artificial knee joint: Secondary | ICD-10-CM | POA: Diagnosis not present

## 2015-12-17 DIAGNOSIS — R2689 Other abnormalities of gait and mobility: Secondary | ICD-10-CM | POA: Diagnosis not present

## 2015-12-19 DIAGNOSIS — M25561 Pain in right knee: Secondary | ICD-10-CM | POA: Diagnosis not present

## 2015-12-19 DIAGNOSIS — M62551 Muscle wasting and atrophy, not elsewhere classified, right thigh: Secondary | ICD-10-CM | POA: Diagnosis not present

## 2015-12-19 DIAGNOSIS — Z96651 Presence of right artificial knee joint: Secondary | ICD-10-CM | POA: Diagnosis not present

## 2015-12-19 DIAGNOSIS — R2689 Other abnormalities of gait and mobility: Secondary | ICD-10-CM | POA: Diagnosis not present

## 2015-12-24 DIAGNOSIS — R2689 Other abnormalities of gait and mobility: Secondary | ICD-10-CM | POA: Diagnosis not present

## 2015-12-24 DIAGNOSIS — M62551 Muscle wasting and atrophy, not elsewhere classified, right thigh: Secondary | ICD-10-CM | POA: Diagnosis not present

## 2015-12-24 DIAGNOSIS — Z96651 Presence of right artificial knee joint: Secondary | ICD-10-CM | POA: Diagnosis not present

## 2015-12-24 DIAGNOSIS — M25561 Pain in right knee: Secondary | ICD-10-CM | POA: Diagnosis not present

## 2015-12-26 DIAGNOSIS — Z96651 Presence of right artificial knee joint: Secondary | ICD-10-CM | POA: Diagnosis not present

## 2015-12-26 DIAGNOSIS — R2689 Other abnormalities of gait and mobility: Secondary | ICD-10-CM | POA: Diagnosis not present

## 2015-12-26 DIAGNOSIS — M62551 Muscle wasting and atrophy, not elsewhere classified, right thigh: Secondary | ICD-10-CM | POA: Diagnosis not present

## 2015-12-26 DIAGNOSIS — M25561 Pain in right knee: Secondary | ICD-10-CM | POA: Diagnosis not present

## 2015-12-27 DIAGNOSIS — R05 Cough: Secondary | ICD-10-CM | POA: Diagnosis not present

## 2015-12-27 DIAGNOSIS — R062 Wheezing: Secondary | ICD-10-CM | POA: Diagnosis not present

## 2015-12-28 DIAGNOSIS — R2689 Other abnormalities of gait and mobility: Secondary | ICD-10-CM | POA: Diagnosis not present

## 2015-12-28 DIAGNOSIS — M62551 Muscle wasting and atrophy, not elsewhere classified, right thigh: Secondary | ICD-10-CM | POA: Diagnosis not present

## 2015-12-28 DIAGNOSIS — M25561 Pain in right knee: Secondary | ICD-10-CM | POA: Diagnosis not present

## 2015-12-28 DIAGNOSIS — Z96651 Presence of right artificial knee joint: Secondary | ICD-10-CM | POA: Diagnosis not present

## 2016-01-09 DIAGNOSIS — Z471 Aftercare following joint replacement surgery: Secondary | ICD-10-CM | POA: Diagnosis not present

## 2016-01-09 DIAGNOSIS — Z96651 Presence of right artificial knee joint: Secondary | ICD-10-CM | POA: Diagnosis not present

## 2016-01-18 DIAGNOSIS — R05 Cough: Secondary | ICD-10-CM | POA: Diagnosis not present

## 2016-02-05 DIAGNOSIS — Z961 Presence of intraocular lens: Secondary | ICD-10-CM | POA: Diagnosis not present

## 2016-06-02 DIAGNOSIS — Z0189 Encounter for other specified special examinations: Secondary | ICD-10-CM | POA: Diagnosis not present

## 2016-06-02 DIAGNOSIS — N401 Enlarged prostate with lower urinary tract symptoms: Secondary | ICD-10-CM | POA: Diagnosis not present

## 2016-06-02 DIAGNOSIS — N138 Other obstructive and reflux uropathy: Secondary | ICD-10-CM | POA: Diagnosis not present

## 2016-06-02 DIAGNOSIS — N200 Calculus of kidney: Secondary | ICD-10-CM | POA: Diagnosis not present

## 2016-06-02 DIAGNOSIS — N289 Disorder of kidney and ureter, unspecified: Secondary | ICD-10-CM | POA: Diagnosis not present

## 2016-06-19 DIAGNOSIS — N289 Disorder of kidney and ureter, unspecified: Secondary | ICD-10-CM | POA: Diagnosis not present

## 2016-06-19 DIAGNOSIS — N200 Calculus of kidney: Secondary | ICD-10-CM | POA: Diagnosis not present

## 2016-06-19 DIAGNOSIS — R310 Gross hematuria: Secondary | ICD-10-CM | POA: Diagnosis not present

## 2016-06-19 DIAGNOSIS — N23 Unspecified renal colic: Secondary | ICD-10-CM | POA: Diagnosis not present

## 2016-07-23 DIAGNOSIS — Z0000 Encounter for general adult medical examination without abnormal findings: Secondary | ICD-10-CM | POA: Diagnosis not present

## 2016-07-23 DIAGNOSIS — E782 Mixed hyperlipidemia: Secondary | ICD-10-CM | POA: Diagnosis not present

## 2016-07-23 DIAGNOSIS — E1122 Type 2 diabetes mellitus with diabetic chronic kidney disease: Secondary | ICD-10-CM | POA: Diagnosis not present

## 2016-07-23 DIAGNOSIS — I2510 Atherosclerotic heart disease of native coronary artery without angina pectoris: Secondary | ICD-10-CM | POA: Diagnosis not present

## 2016-07-23 DIAGNOSIS — R809 Proteinuria, unspecified: Secondary | ICD-10-CM | POA: Diagnosis not present

## 2016-07-23 DIAGNOSIS — Z794 Long term (current) use of insulin: Secondary | ICD-10-CM | POA: Diagnosis not present

## 2016-07-23 DIAGNOSIS — E1129 Type 2 diabetes mellitus with other diabetic kidney complication: Secondary | ICD-10-CM | POA: Diagnosis not present

## 2016-07-23 DIAGNOSIS — I119 Hypertensive heart disease without heart failure: Secondary | ICD-10-CM | POA: Diagnosis not present

## 2016-07-31 DIAGNOSIS — N200 Calculus of kidney: Secondary | ICD-10-CM | POA: Diagnosis not present

## 2016-11-06 DIAGNOSIS — N200 Calculus of kidney: Secondary | ICD-10-CM | POA: Diagnosis not present

## 2016-11-06 DIAGNOSIS — R945 Abnormal results of liver function studies: Secondary | ICD-10-CM | POA: Diagnosis not present

## 2016-11-21 DIAGNOSIS — Z23 Encounter for immunization: Secondary | ICD-10-CM | POA: Diagnosis not present

## 2016-12-17 DIAGNOSIS — S39011A Strain of muscle, fascia and tendon of abdomen, initial encounter: Secondary | ICD-10-CM | POA: Diagnosis not present

## 2016-12-17 DIAGNOSIS — R1011 Right upper quadrant pain: Secondary | ICD-10-CM | POA: Diagnosis not present

## 2016-12-20 DIAGNOSIS — J189 Pneumonia, unspecified organism: Secondary | ICD-10-CM | POA: Diagnosis not present

## 2017-02-12 DIAGNOSIS — N200 Calculus of kidney: Secondary | ICD-10-CM | POA: Diagnosis not present

## 2017-04-24 DIAGNOSIS — E1169 Type 2 diabetes mellitus with other specified complication: Secondary | ICD-10-CM | POA: Diagnosis not present

## 2017-04-24 DIAGNOSIS — I25119 Atherosclerotic heart disease of native coronary artery with unspecified angina pectoris: Secondary | ICD-10-CM | POA: Diagnosis not present

## 2017-04-24 DIAGNOSIS — E782 Mixed hyperlipidemia: Secondary | ICD-10-CM | POA: Diagnosis not present

## 2017-04-24 DIAGNOSIS — E1151 Type 2 diabetes mellitus with diabetic peripheral angiopathy without gangrene: Secondary | ICD-10-CM | POA: Diagnosis not present

## 2017-05-13 DIAGNOSIS — N200 Calculus of kidney: Secondary | ICD-10-CM | POA: Diagnosis not present

## 2017-05-28 DIAGNOSIS — M545 Low back pain: Secondary | ICD-10-CM | POA: Diagnosis not present

## 2017-05-28 DIAGNOSIS — M1711 Unilateral primary osteoarthritis, right knee: Secondary | ICD-10-CM | POA: Diagnosis not present

## 2017-05-28 DIAGNOSIS — Z96651 Presence of right artificial knee joint: Secondary | ICD-10-CM | POA: Diagnosis not present

## 2017-05-28 DIAGNOSIS — Z471 Aftercare following joint replacement surgery: Secondary | ICD-10-CM | POA: Diagnosis not present

## 2017-06-01 DIAGNOSIS — I714 Abdominal aortic aneurysm, without rupture: Secondary | ICD-10-CM | POA: Diagnosis not present

## 2017-06-01 DIAGNOSIS — Z6831 Body mass index (BMI) 31.0-31.9, adult: Secondary | ICD-10-CM | POA: Diagnosis not present

## 2017-06-03 DIAGNOSIS — M545 Low back pain: Secondary | ICD-10-CM | POA: Diagnosis not present

## 2017-06-03 DIAGNOSIS — M48061 Spinal stenosis, lumbar region without neurogenic claudication: Secondary | ICD-10-CM | POA: Diagnosis not present

## 2017-06-11 DIAGNOSIS — I77811 Abdominal aortic ectasia: Secondary | ICD-10-CM | POA: Diagnosis not present

## 2017-06-11 DIAGNOSIS — N200 Calculus of kidney: Secondary | ICD-10-CM | POA: Diagnosis not present

## 2017-06-11 DIAGNOSIS — I714 Abdominal aortic aneurysm, without rupture: Secondary | ICD-10-CM | POA: Diagnosis not present

## 2017-07-09 DIAGNOSIS — M65331 Trigger finger, right middle finger: Secondary | ICD-10-CM | POA: Diagnosis not present

## 2017-07-10 DIAGNOSIS — Z96651 Presence of right artificial knee joint: Secondary | ICD-10-CM | POA: Diagnosis not present

## 2017-07-10 DIAGNOSIS — Z471 Aftercare following joint replacement surgery: Secondary | ICD-10-CM | POA: Diagnosis not present

## 2017-07-10 DIAGNOSIS — M25561 Pain in right knee: Secondary | ICD-10-CM | POA: Diagnosis not present

## 2017-07-17 DIAGNOSIS — M65331 Trigger finger, right middle finger: Secondary | ICD-10-CM | POA: Diagnosis not present

## 2017-08-13 DIAGNOSIS — E1169 Type 2 diabetes mellitus with other specified complication: Secondary | ICD-10-CM | POA: Diagnosis not present

## 2017-08-13 DIAGNOSIS — E1151 Type 2 diabetes mellitus with diabetic peripheral angiopathy without gangrene: Secondary | ICD-10-CM | POA: Diagnosis not present

## 2017-08-20 DIAGNOSIS — N200 Calculus of kidney: Secondary | ICD-10-CM | POA: Diagnosis not present

## 2017-08-21 DIAGNOSIS — Z9181 History of falling: Secondary | ICD-10-CM | POA: Diagnosis not present

## 2017-08-21 DIAGNOSIS — Z1331 Encounter for screening for depression: Secondary | ICD-10-CM | POA: Diagnosis not present

## 2017-08-21 DIAGNOSIS — Z0000 Encounter for general adult medical examination without abnormal findings: Secondary | ICD-10-CM | POA: Diagnosis not present

## 2017-08-21 DIAGNOSIS — E782 Mixed hyperlipidemia: Secondary | ICD-10-CM | POA: Diagnosis not present

## 2017-08-21 DIAGNOSIS — E1169 Type 2 diabetes mellitus with other specified complication: Secondary | ICD-10-CM | POA: Diagnosis not present

## 2017-08-21 DIAGNOSIS — Z139 Encounter for screening, unspecified: Secondary | ICD-10-CM | POA: Diagnosis not present

## 2017-08-21 DIAGNOSIS — I25119 Atherosclerotic heart disease of native coronary artery with unspecified angina pectoris: Secondary | ICD-10-CM | POA: Diagnosis not present

## 2017-08-21 DIAGNOSIS — E1151 Type 2 diabetes mellitus with diabetic peripheral angiopathy without gangrene: Secondary | ICD-10-CM | POA: Diagnosis not present

## 2017-08-31 ENCOUNTER — Other Ambulatory Visit: Payer: Self-pay

## 2017-10-05 DIAGNOSIS — R062 Wheezing: Secondary | ICD-10-CM | POA: Diagnosis not present

## 2017-10-05 DIAGNOSIS — Z139 Encounter for screening, unspecified: Secondary | ICD-10-CM | POA: Diagnosis not present

## 2017-10-05 DIAGNOSIS — Z6831 Body mass index (BMI) 31.0-31.9, adult: Secondary | ICD-10-CM | POA: Diagnosis not present

## 2017-11-12 DIAGNOSIS — M65332 Trigger finger, left middle finger: Secondary | ICD-10-CM | POA: Diagnosis not present

## 2017-11-19 DIAGNOSIS — E1151 Type 2 diabetes mellitus with diabetic peripheral angiopathy without gangrene: Secondary | ICD-10-CM | POA: Diagnosis not present

## 2017-11-19 DIAGNOSIS — E1169 Type 2 diabetes mellitus with other specified complication: Secondary | ICD-10-CM | POA: Diagnosis not present

## 2017-11-26 DIAGNOSIS — Z23 Encounter for immunization: Secondary | ICD-10-CM | POA: Diagnosis not present

## 2017-11-26 DIAGNOSIS — N183 Chronic kidney disease, stage 3 (moderate): Secondary | ICD-10-CM | POA: Diagnosis not present

## 2017-11-26 DIAGNOSIS — E782 Mixed hyperlipidemia: Secondary | ICD-10-CM | POA: Diagnosis not present

## 2017-11-26 DIAGNOSIS — E1169 Type 2 diabetes mellitus with other specified complication: Secondary | ICD-10-CM | POA: Diagnosis not present

## 2017-11-26 DIAGNOSIS — E1151 Type 2 diabetes mellitus with diabetic peripheral angiopathy without gangrene: Secondary | ICD-10-CM | POA: Diagnosis not present

## 2017-11-27 DIAGNOSIS — N200 Calculus of kidney: Secondary | ICD-10-CM | POA: Diagnosis not present

## 2017-12-17 ENCOUNTER — Other Ambulatory Visit: Payer: Self-pay

## 2018-02-05 DIAGNOSIS — Z6830 Body mass index (BMI) 30.0-30.9, adult: Secondary | ICD-10-CM | POA: Diagnosis not present

## 2018-02-05 DIAGNOSIS — E1151 Type 2 diabetes mellitus with diabetic peripheral angiopathy without gangrene: Secondary | ICD-10-CM | POA: Diagnosis not present

## 2018-02-18 DIAGNOSIS — E669 Obesity, unspecified: Secondary | ICD-10-CM | POA: Diagnosis not present

## 2018-02-18 DIAGNOSIS — E1151 Type 2 diabetes mellitus with diabetic peripheral angiopathy without gangrene: Secondary | ICD-10-CM | POA: Diagnosis not present

## 2018-02-18 DIAGNOSIS — Z6830 Body mass index (BMI) 30.0-30.9, adult: Secondary | ICD-10-CM | POA: Diagnosis not present

## 2018-03-05 DIAGNOSIS — Z6830 Body mass index (BMI) 30.0-30.9, adult: Secondary | ICD-10-CM | POA: Diagnosis not present

## 2018-03-05 DIAGNOSIS — E669 Obesity, unspecified: Secondary | ICD-10-CM | POA: Diagnosis not present

## 2018-03-05 DIAGNOSIS — E1151 Type 2 diabetes mellitus with diabetic peripheral angiopathy without gangrene: Secondary | ICD-10-CM | POA: Diagnosis not present

## 2018-03-11 DIAGNOSIS — N200 Calculus of kidney: Secondary | ICD-10-CM | POA: Diagnosis not present

## 2018-03-31 DIAGNOSIS — E1169 Type 2 diabetes mellitus with other specified complication: Secondary | ICD-10-CM | POA: Diagnosis not present

## 2018-03-31 DIAGNOSIS — E1151 Type 2 diabetes mellitus with diabetic peripheral angiopathy without gangrene: Secondary | ICD-10-CM | POA: Diagnosis not present

## 2018-04-02 DIAGNOSIS — Z6830 Body mass index (BMI) 30.0-30.9, adult: Secondary | ICD-10-CM | POA: Diagnosis not present

## 2018-04-02 DIAGNOSIS — E1151 Type 2 diabetes mellitus with diabetic peripheral angiopathy without gangrene: Secondary | ICD-10-CM | POA: Diagnosis not present

## 2018-04-02 DIAGNOSIS — E669 Obesity, unspecified: Secondary | ICD-10-CM | POA: Diagnosis not present

## 2018-04-08 DIAGNOSIS — E1151 Type 2 diabetes mellitus with diabetic peripheral angiopathy without gangrene: Secondary | ICD-10-CM | POA: Diagnosis not present

## 2018-04-08 DIAGNOSIS — E1169 Type 2 diabetes mellitus with other specified complication: Secondary | ICD-10-CM | POA: Diagnosis not present

## 2018-04-08 DIAGNOSIS — Z789 Other specified health status: Secondary | ICD-10-CM | POA: Diagnosis not present

## 2018-04-08 DIAGNOSIS — E782 Mixed hyperlipidemia: Secondary | ICD-10-CM | POA: Diagnosis not present

## 2018-04-27 DIAGNOSIS — E1151 Type 2 diabetes mellitus with diabetic peripheral angiopathy without gangrene: Secondary | ICD-10-CM | POA: Diagnosis not present

## 2018-04-27 DIAGNOSIS — E782 Mixed hyperlipidemia: Secondary | ICD-10-CM | POA: Diagnosis not present

## 2018-04-27 DIAGNOSIS — E1169 Type 2 diabetes mellitus with other specified complication: Secondary | ICD-10-CM | POA: Diagnosis not present

## 2018-04-27 DIAGNOSIS — Z6830 Body mass index (BMI) 30.0-30.9, adult: Secondary | ICD-10-CM | POA: Diagnosis not present

## 2018-05-27 DIAGNOSIS — E1169 Type 2 diabetes mellitus with other specified complication: Secondary | ICD-10-CM | POA: Diagnosis not present

## 2018-05-27 DIAGNOSIS — E782 Mixed hyperlipidemia: Secondary | ICD-10-CM | POA: Diagnosis not present

## 2018-05-27 DIAGNOSIS — Z6830 Body mass index (BMI) 30.0-30.9, adult: Secondary | ICD-10-CM | POA: Diagnosis not present

## 2018-05-27 DIAGNOSIS — E1151 Type 2 diabetes mellitus with diabetic peripheral angiopathy without gangrene: Secondary | ICD-10-CM | POA: Diagnosis not present

## 2018-06-25 DIAGNOSIS — E1151 Type 2 diabetes mellitus with diabetic peripheral angiopathy without gangrene: Secondary | ICD-10-CM | POA: Diagnosis not present

## 2018-06-25 DIAGNOSIS — E1169 Type 2 diabetes mellitus with other specified complication: Secondary | ICD-10-CM | POA: Diagnosis not present

## 2018-06-25 DIAGNOSIS — Z6830 Body mass index (BMI) 30.0-30.9, adult: Secondary | ICD-10-CM | POA: Diagnosis not present

## 2018-06-25 DIAGNOSIS — E782 Mixed hyperlipidemia: Secondary | ICD-10-CM | POA: Diagnosis not present

## 2018-07-01 DIAGNOSIS — E1169 Type 2 diabetes mellitus with other specified complication: Secondary | ICD-10-CM | POA: Diagnosis not present

## 2018-07-08 DIAGNOSIS — Z1331 Encounter for screening for depression: Secondary | ICD-10-CM | POA: Diagnosis not present

## 2018-07-08 DIAGNOSIS — Z139 Encounter for screening, unspecified: Secondary | ICD-10-CM | POA: Diagnosis not present

## 2018-07-08 DIAGNOSIS — E1122 Type 2 diabetes mellitus with diabetic chronic kidney disease: Secondary | ICD-10-CM | POA: Diagnosis not present

## 2018-07-08 DIAGNOSIS — N183 Chronic kidney disease, stage 3 (moderate): Secondary | ICD-10-CM | POA: Diagnosis not present

## 2018-07-08 DIAGNOSIS — E782 Mixed hyperlipidemia: Secondary | ICD-10-CM | POA: Diagnosis not present

## 2018-07-08 DIAGNOSIS — E1169 Type 2 diabetes mellitus with other specified complication: Secondary | ICD-10-CM | POA: Diagnosis not present

## 2018-07-27 DIAGNOSIS — E782 Mixed hyperlipidemia: Secondary | ICD-10-CM | POA: Diagnosis not present

## 2018-07-27 DIAGNOSIS — E1169 Type 2 diabetes mellitus with other specified complication: Secondary | ICD-10-CM | POA: Diagnosis not present

## 2018-08-27 DIAGNOSIS — E782 Mixed hyperlipidemia: Secondary | ICD-10-CM | POA: Diagnosis not present

## 2018-08-27 DIAGNOSIS — N183 Chronic kidney disease, stage 3 (moderate): Secondary | ICD-10-CM | POA: Diagnosis not present

## 2018-08-27 DIAGNOSIS — E1169 Type 2 diabetes mellitus with other specified complication: Secondary | ICD-10-CM | POA: Diagnosis not present

## 2018-09-09 DIAGNOSIS — N138 Other obstructive and reflux uropathy: Secondary | ICD-10-CM | POA: Diagnosis not present

## 2018-09-09 DIAGNOSIS — N401 Enlarged prostate with lower urinary tract symptoms: Secondary | ICD-10-CM | POA: Diagnosis not present

## 2018-09-09 DIAGNOSIS — N200 Calculus of kidney: Secondary | ICD-10-CM | POA: Diagnosis not present

## 2018-09-27 DIAGNOSIS — E782 Mixed hyperlipidemia: Secondary | ICD-10-CM | POA: Diagnosis not present

## 2018-09-27 DIAGNOSIS — N183 Chronic kidney disease, stage 3 (moderate): Secondary | ICD-10-CM | POA: Diagnosis not present

## 2018-09-27 DIAGNOSIS — E1169 Type 2 diabetes mellitus with other specified complication: Secondary | ICD-10-CM | POA: Diagnosis not present

## 2018-10-27 DIAGNOSIS — E1169 Type 2 diabetes mellitus with other specified complication: Secondary | ICD-10-CM | POA: Diagnosis not present

## 2018-10-27 DIAGNOSIS — E782 Mixed hyperlipidemia: Secondary | ICD-10-CM | POA: Diagnosis not present

## 2018-10-27 DIAGNOSIS — N183 Chronic kidney disease, stage 3 (moderate): Secondary | ICD-10-CM | POA: Diagnosis not present

## 2018-10-28 DIAGNOSIS — E1169 Type 2 diabetes mellitus with other specified complication: Secondary | ICD-10-CM | POA: Diagnosis not present

## 2018-10-28 DIAGNOSIS — Z23 Encounter for immunization: Secondary | ICD-10-CM | POA: Diagnosis not present

## 2018-11-04 DIAGNOSIS — Z139 Encounter for screening, unspecified: Secondary | ICD-10-CM | POA: Diagnosis not present

## 2018-11-04 DIAGNOSIS — E1122 Type 2 diabetes mellitus with diabetic chronic kidney disease: Secondary | ICD-10-CM | POA: Diagnosis not present

## 2018-11-04 DIAGNOSIS — E1169 Type 2 diabetes mellitus with other specified complication: Secondary | ICD-10-CM | POA: Diagnosis not present

## 2018-11-04 DIAGNOSIS — Z0000 Encounter for general adult medical examination without abnormal findings: Secondary | ICD-10-CM | POA: Diagnosis not present

## 2018-11-04 DIAGNOSIS — Z1331 Encounter for screening for depression: Secondary | ICD-10-CM | POA: Diagnosis not present

## 2018-11-04 DIAGNOSIS — Z1339 Encounter for screening examination for other mental health and behavioral disorders: Secondary | ICD-10-CM | POA: Diagnosis not present

## 2018-11-04 DIAGNOSIS — Z7189 Other specified counseling: Secondary | ICD-10-CM | POA: Diagnosis not present

## 2018-11-04 DIAGNOSIS — E782 Mixed hyperlipidemia: Secondary | ICD-10-CM | POA: Diagnosis not present

## 2018-11-04 DIAGNOSIS — N183 Chronic kidney disease, stage 3 unspecified: Secondary | ICD-10-CM | POA: Diagnosis not present

## 2018-11-26 DIAGNOSIS — N183 Chronic kidney disease, stage 3 unspecified: Secondary | ICD-10-CM | POA: Diagnosis not present

## 2018-11-26 DIAGNOSIS — E1169 Type 2 diabetes mellitus with other specified complication: Secondary | ICD-10-CM | POA: Diagnosis not present

## 2018-11-26 DIAGNOSIS — E1122 Type 2 diabetes mellitus with diabetic chronic kidney disease: Secondary | ICD-10-CM | POA: Diagnosis not present

## 2018-11-26 DIAGNOSIS — E782 Mixed hyperlipidemia: Secondary | ICD-10-CM | POA: Diagnosis not present

## 2018-12-27 DIAGNOSIS — E782 Mixed hyperlipidemia: Secondary | ICD-10-CM | POA: Diagnosis not present

## 2018-12-27 DIAGNOSIS — E1122 Type 2 diabetes mellitus with diabetic chronic kidney disease: Secondary | ICD-10-CM | POA: Diagnosis not present

## 2018-12-27 DIAGNOSIS — N183 Chronic kidney disease, stage 3 unspecified: Secondary | ICD-10-CM | POA: Diagnosis not present

## 2018-12-27 DIAGNOSIS — E1169 Type 2 diabetes mellitus with other specified complication: Secondary | ICD-10-CM | POA: Diagnosis not present

## 2019-02-25 DIAGNOSIS — E1122 Type 2 diabetes mellitus with diabetic chronic kidney disease: Secondary | ICD-10-CM | POA: Diagnosis not present

## 2019-02-25 DIAGNOSIS — E1169 Type 2 diabetes mellitus with other specified complication: Secondary | ICD-10-CM | POA: Diagnosis not present

## 2019-02-25 DIAGNOSIS — E782 Mixed hyperlipidemia: Secondary | ICD-10-CM | POA: Diagnosis not present

## 2019-02-25 DIAGNOSIS — N183 Chronic kidney disease, stage 3 unspecified: Secondary | ICD-10-CM | POA: Diagnosis not present

## 2019-03-03 DIAGNOSIS — E1169 Type 2 diabetes mellitus with other specified complication: Secondary | ICD-10-CM | POA: Diagnosis not present

## 2019-03-10 DIAGNOSIS — E1169 Type 2 diabetes mellitus with other specified complication: Secondary | ICD-10-CM | POA: Diagnosis not present

## 2019-03-10 DIAGNOSIS — E1122 Type 2 diabetes mellitus with diabetic chronic kidney disease: Secondary | ICD-10-CM | POA: Diagnosis not present

## 2019-03-10 DIAGNOSIS — E782 Mixed hyperlipidemia: Secondary | ICD-10-CM | POA: Diagnosis not present

## 2019-03-10 DIAGNOSIS — E1151 Type 2 diabetes mellitus with diabetic peripheral angiopathy without gangrene: Secondary | ICD-10-CM | POA: Diagnosis not present

## 2019-03-14 DIAGNOSIS — M25512 Pain in left shoulder: Secondary | ICD-10-CM | POA: Diagnosis not present

## 2019-03-22 DIAGNOSIS — N200 Calculus of kidney: Secondary | ICD-10-CM | POA: Diagnosis not present

## 2019-03-27 DIAGNOSIS — E1122 Type 2 diabetes mellitus with diabetic chronic kidney disease: Secondary | ICD-10-CM | POA: Diagnosis not present

## 2019-03-27 DIAGNOSIS — E1151 Type 2 diabetes mellitus with diabetic peripheral angiopathy without gangrene: Secondary | ICD-10-CM | POA: Diagnosis not present

## 2019-03-27 DIAGNOSIS — E1169 Type 2 diabetes mellitus with other specified complication: Secondary | ICD-10-CM | POA: Diagnosis not present

## 2019-03-27 DIAGNOSIS — E782 Mixed hyperlipidemia: Secondary | ICD-10-CM | POA: Diagnosis not present

## 2019-04-07 DIAGNOSIS — Z6831 Body mass index (BMI) 31.0-31.9, adult: Secondary | ICD-10-CM | POA: Diagnosis not present

## 2019-04-07 DIAGNOSIS — E669 Obesity, unspecified: Secondary | ICD-10-CM | POA: Diagnosis not present

## 2019-04-07 DIAGNOSIS — E1169 Type 2 diabetes mellitus with other specified complication: Secondary | ICD-10-CM | POA: Diagnosis not present

## 2019-04-11 DIAGNOSIS — M25512 Pain in left shoulder: Secondary | ICD-10-CM | POA: Diagnosis not present

## 2019-04-11 DIAGNOSIS — M7542 Impingement syndrome of left shoulder: Secondary | ICD-10-CM | POA: Diagnosis not present

## 2019-04-21 DIAGNOSIS — E669 Obesity, unspecified: Secondary | ICD-10-CM | POA: Diagnosis not present

## 2019-04-21 DIAGNOSIS — Z6831 Body mass index (BMI) 31.0-31.9, adult: Secondary | ICD-10-CM | POA: Diagnosis not present

## 2019-04-27 DIAGNOSIS — E782 Mixed hyperlipidemia: Secondary | ICD-10-CM | POA: Diagnosis not present

## 2019-04-27 DIAGNOSIS — E1151 Type 2 diabetes mellitus with diabetic peripheral angiopathy without gangrene: Secondary | ICD-10-CM | POA: Diagnosis not present

## 2019-04-27 DIAGNOSIS — E1122 Type 2 diabetes mellitus with diabetic chronic kidney disease: Secondary | ICD-10-CM | POA: Diagnosis not present

## 2019-04-27 DIAGNOSIS — E1169 Type 2 diabetes mellitus with other specified complication: Secondary | ICD-10-CM | POA: Diagnosis not present

## 2019-05-05 DIAGNOSIS — N183 Chronic kidney disease, stage 3 unspecified: Secondary | ICD-10-CM | POA: Diagnosis not present

## 2019-05-05 DIAGNOSIS — E1122 Type 2 diabetes mellitus with diabetic chronic kidney disease: Secondary | ICD-10-CM | POA: Diagnosis not present

## 2019-05-27 DIAGNOSIS — E1169 Type 2 diabetes mellitus with other specified complication: Secondary | ICD-10-CM | POA: Diagnosis not present

## 2019-05-27 DIAGNOSIS — E1151 Type 2 diabetes mellitus with diabetic peripheral angiopathy without gangrene: Secondary | ICD-10-CM | POA: Diagnosis not present

## 2019-05-27 DIAGNOSIS — E782 Mixed hyperlipidemia: Secondary | ICD-10-CM | POA: Diagnosis not present

## 2019-05-27 DIAGNOSIS — E1122 Type 2 diabetes mellitus with diabetic chronic kidney disease: Secondary | ICD-10-CM | POA: Diagnosis not present

## 2019-06-02 DIAGNOSIS — E1169 Type 2 diabetes mellitus with other specified complication: Secondary | ICD-10-CM | POA: Diagnosis not present

## 2019-06-10 DIAGNOSIS — N183 Chronic kidney disease, stage 3 unspecified: Secondary | ICD-10-CM | POA: Diagnosis not present

## 2019-06-10 DIAGNOSIS — E1169 Type 2 diabetes mellitus with other specified complication: Secondary | ICD-10-CM | POA: Diagnosis not present

## 2019-06-10 DIAGNOSIS — E1122 Type 2 diabetes mellitus with diabetic chronic kidney disease: Secondary | ICD-10-CM | POA: Diagnosis not present

## 2019-06-10 DIAGNOSIS — E782 Mixed hyperlipidemia: Secondary | ICD-10-CM | POA: Diagnosis not present

## 2019-06-27 DIAGNOSIS — N1832 Chronic kidney disease, stage 3b: Secondary | ICD-10-CM | POA: Diagnosis not present

## 2019-06-27 DIAGNOSIS — E782 Mixed hyperlipidemia: Secondary | ICD-10-CM | POA: Diagnosis not present

## 2019-06-27 DIAGNOSIS — E1169 Type 2 diabetes mellitus with other specified complication: Secondary | ICD-10-CM | POA: Diagnosis not present

## 2019-06-27 DIAGNOSIS — E1122 Type 2 diabetes mellitus with diabetic chronic kidney disease: Secondary | ICD-10-CM | POA: Diagnosis not present

## 2019-08-28 DIAGNOSIS — N1832 Chronic kidney disease, stage 3b: Secondary | ICD-10-CM | POA: Diagnosis not present

## 2019-08-28 DIAGNOSIS — E782 Mixed hyperlipidemia: Secondary | ICD-10-CM | POA: Diagnosis not present

## 2019-08-28 DIAGNOSIS — E1169 Type 2 diabetes mellitus with other specified complication: Secondary | ICD-10-CM | POA: Diagnosis not present

## 2019-09-01 DIAGNOSIS — M79641 Pain in right hand: Secondary | ICD-10-CM | POA: Diagnosis not present

## 2019-09-02 DIAGNOSIS — E1169 Type 2 diabetes mellitus with other specified complication: Secondary | ICD-10-CM | POA: Diagnosis not present

## 2019-09-09 DIAGNOSIS — I25119 Atherosclerotic heart disease of native coronary artery with unspecified angina pectoris: Secondary | ICD-10-CM | POA: Diagnosis not present

## 2019-09-09 DIAGNOSIS — E782 Mixed hyperlipidemia: Secondary | ICD-10-CM | POA: Diagnosis not present

## 2019-09-09 DIAGNOSIS — I714 Abdominal aortic aneurysm, without rupture: Secondary | ICD-10-CM | POA: Diagnosis not present

## 2019-09-09 DIAGNOSIS — E1169 Type 2 diabetes mellitus with other specified complication: Secondary | ICD-10-CM | POA: Diagnosis not present

## 2019-09-22 DIAGNOSIS — N200 Calculus of kidney: Secondary | ICD-10-CM | POA: Diagnosis not present

## 2019-09-27 DIAGNOSIS — I714 Abdominal aortic aneurysm, without rupture: Secondary | ICD-10-CM | POA: Diagnosis not present

## 2019-09-27 DIAGNOSIS — N1832 Chronic kidney disease, stage 3b: Secondary | ICD-10-CM | POA: Diagnosis not present

## 2019-09-27 DIAGNOSIS — E1169 Type 2 diabetes mellitus with other specified complication: Secondary | ICD-10-CM | POA: Diagnosis not present

## 2019-09-27 DIAGNOSIS — E782 Mixed hyperlipidemia: Secondary | ICD-10-CM | POA: Diagnosis not present

## 2019-09-30 DIAGNOSIS — I701 Atherosclerosis of renal artery: Secondary | ICD-10-CM | POA: Diagnosis not present

## 2019-09-30 DIAGNOSIS — K551 Chronic vascular disorders of intestine: Secondary | ICD-10-CM | POA: Diagnosis not present

## 2019-09-30 DIAGNOSIS — I714 Abdominal aortic aneurysm, without rupture: Secondary | ICD-10-CM | POA: Diagnosis not present

## 2019-10-01 DIAGNOSIS — N289 Disorder of kidney and ureter, unspecified: Secondary | ICD-10-CM | POA: Diagnosis not present

## 2019-10-01 DIAGNOSIS — R05 Cough: Secondary | ICD-10-CM | POA: Diagnosis not present

## 2019-10-01 DIAGNOSIS — E1165 Type 2 diabetes mellitus with hyperglycemia: Secondary | ICD-10-CM | POA: Diagnosis not present

## 2019-10-01 DIAGNOSIS — D696 Thrombocytopenia, unspecified: Secondary | ICD-10-CM | POA: Diagnosis not present

## 2019-10-01 DIAGNOSIS — I700 Atherosclerosis of aorta: Secondary | ICD-10-CM | POA: Diagnosis not present

## 2019-10-01 DIAGNOSIS — Z951 Presence of aortocoronary bypass graft: Secondary | ICD-10-CM | POA: Diagnosis not present

## 2019-10-01 DIAGNOSIS — U071 COVID-19: Secondary | ICD-10-CM | POA: Diagnosis not present

## 2019-10-05 DIAGNOSIS — U071 COVID-19: Secondary | ICD-10-CM | POA: Diagnosis not present

## 2019-10-28 DIAGNOSIS — E782 Mixed hyperlipidemia: Secondary | ICD-10-CM | POA: Diagnosis not present

## 2019-10-28 DIAGNOSIS — I714 Abdominal aortic aneurysm, without rupture: Secondary | ICD-10-CM | POA: Diagnosis not present

## 2019-10-28 DIAGNOSIS — E1169 Type 2 diabetes mellitus with other specified complication: Secondary | ICD-10-CM | POA: Diagnosis not present

## 2019-10-28 DIAGNOSIS — N1832 Chronic kidney disease, stage 3b: Secondary | ICD-10-CM | POA: Diagnosis not present

## 2019-11-28 DIAGNOSIS — I714 Abdominal aortic aneurysm, without rupture: Secondary | ICD-10-CM | POA: Diagnosis not present

## 2019-11-28 DIAGNOSIS — E782 Mixed hyperlipidemia: Secondary | ICD-10-CM | POA: Diagnosis not present

## 2019-11-28 DIAGNOSIS — E1169 Type 2 diabetes mellitus with other specified complication: Secondary | ICD-10-CM | POA: Diagnosis not present

## 2019-11-28 DIAGNOSIS — N1832 Chronic kidney disease, stage 3b: Secondary | ICD-10-CM | POA: Diagnosis not present

## 2019-12-01 DIAGNOSIS — Z1331 Encounter for screening for depression: Secondary | ICD-10-CM | POA: Diagnosis not present

## 2019-12-01 DIAGNOSIS — Z0000 Encounter for general adult medical examination without abnormal findings: Secondary | ICD-10-CM | POA: Diagnosis not present

## 2019-12-01 DIAGNOSIS — E1169 Type 2 diabetes mellitus with other specified complication: Secondary | ICD-10-CM | POA: Diagnosis not present

## 2019-12-01 DIAGNOSIS — Z1339 Encounter for screening examination for other mental health and behavioral disorders: Secondary | ICD-10-CM | POA: Diagnosis not present

## 2019-12-01 DIAGNOSIS — Z139 Encounter for screening, unspecified: Secondary | ICD-10-CM | POA: Diagnosis not present

## 2019-12-07 DIAGNOSIS — D485 Neoplasm of uncertain behavior of skin: Secondary | ICD-10-CM | POA: Diagnosis not present

## 2019-12-12 DIAGNOSIS — E1169 Type 2 diabetes mellitus with other specified complication: Secondary | ICD-10-CM | POA: Diagnosis not present

## 2019-12-12 DIAGNOSIS — Z23 Encounter for immunization: Secondary | ICD-10-CM | POA: Diagnosis not present

## 2019-12-12 DIAGNOSIS — E1122 Type 2 diabetes mellitus with diabetic chronic kidney disease: Secondary | ICD-10-CM | POA: Diagnosis not present

## 2019-12-12 DIAGNOSIS — N1832 Chronic kidney disease, stage 3b: Secondary | ICD-10-CM | POA: Diagnosis not present

## 2019-12-12 DIAGNOSIS — E782 Mixed hyperlipidemia: Secondary | ICD-10-CM | POA: Diagnosis not present

## 2019-12-28 DIAGNOSIS — E1122 Type 2 diabetes mellitus with diabetic chronic kidney disease: Secondary | ICD-10-CM | POA: Diagnosis not present

## 2019-12-28 DIAGNOSIS — E1169 Type 2 diabetes mellitus with other specified complication: Secondary | ICD-10-CM | POA: Diagnosis not present

## 2019-12-28 DIAGNOSIS — E782 Mixed hyperlipidemia: Secondary | ICD-10-CM | POA: Diagnosis not present

## 2019-12-28 DIAGNOSIS — N1832 Chronic kidney disease, stage 3b: Secondary | ICD-10-CM | POA: Diagnosis not present

## 2019-12-29 DIAGNOSIS — C44329 Squamous cell carcinoma of skin of other parts of face: Secondary | ICD-10-CM | POA: Diagnosis not present

## 2020-01-28 DIAGNOSIS — E1169 Type 2 diabetes mellitus with other specified complication: Secondary | ICD-10-CM | POA: Diagnosis not present

## 2020-01-28 DIAGNOSIS — N1832 Chronic kidney disease, stage 3b: Secondary | ICD-10-CM | POA: Diagnosis not present

## 2020-01-28 DIAGNOSIS — E782 Mixed hyperlipidemia: Secondary | ICD-10-CM | POA: Diagnosis not present

## 2020-01-28 DIAGNOSIS — E1122 Type 2 diabetes mellitus with diabetic chronic kidney disease: Secondary | ICD-10-CM | POA: Diagnosis not present

## 2020-02-08 DIAGNOSIS — Z79899 Other long term (current) drug therapy: Secondary | ICD-10-CM | POA: Diagnosis not present

## 2020-02-28 DIAGNOSIS — E782 Mixed hyperlipidemia: Secondary | ICD-10-CM | POA: Diagnosis not present

## 2020-02-28 DIAGNOSIS — N1832 Chronic kidney disease, stage 3b: Secondary | ICD-10-CM | POA: Diagnosis not present

## 2020-02-28 DIAGNOSIS — E1122 Type 2 diabetes mellitus with diabetic chronic kidney disease: Secondary | ICD-10-CM | POA: Diagnosis not present

## 2020-02-28 DIAGNOSIS — E1169 Type 2 diabetes mellitus with other specified complication: Secondary | ICD-10-CM | POA: Diagnosis not present

## 2020-03-22 DIAGNOSIS — N200 Calculus of kidney: Secondary | ICD-10-CM | POA: Diagnosis not present

## 2020-03-27 DIAGNOSIS — N1832 Chronic kidney disease, stage 3b: Secondary | ICD-10-CM | POA: Diagnosis not present

## 2020-03-27 DIAGNOSIS — E1169 Type 2 diabetes mellitus with other specified complication: Secondary | ICD-10-CM | POA: Diagnosis not present

## 2020-03-27 DIAGNOSIS — E782 Mixed hyperlipidemia: Secondary | ICD-10-CM | POA: Diagnosis not present

## 2020-03-27 DIAGNOSIS — E1122 Type 2 diabetes mellitus with diabetic chronic kidney disease: Secondary | ICD-10-CM | POA: Diagnosis not present

## 2020-04-05 DIAGNOSIS — E1169 Type 2 diabetes mellitus with other specified complication: Secondary | ICD-10-CM | POA: Diagnosis not present

## 2020-04-12 DIAGNOSIS — R233 Spontaneous ecchymoses: Secondary | ICD-10-CM | POA: Diagnosis not present

## 2020-04-12 DIAGNOSIS — L578 Other skin changes due to chronic exposure to nonionizing radiation: Secondary | ICD-10-CM | POA: Diagnosis not present

## 2020-04-12 DIAGNOSIS — C44329 Squamous cell carcinoma of skin of other parts of face: Secondary | ICD-10-CM | POA: Diagnosis not present

## 2020-04-13 DIAGNOSIS — E782 Mixed hyperlipidemia: Secondary | ICD-10-CM | POA: Diagnosis not present

## 2020-04-13 DIAGNOSIS — E1169 Type 2 diabetes mellitus with other specified complication: Secondary | ICD-10-CM | POA: Diagnosis not present

## 2020-04-13 DIAGNOSIS — I714 Abdominal aortic aneurysm, without rupture: Secondary | ICD-10-CM | POA: Diagnosis not present

## 2020-04-13 DIAGNOSIS — I25119 Atherosclerotic heart disease of native coronary artery with unspecified angina pectoris: Secondary | ICD-10-CM | POA: Diagnosis not present

## 2020-04-27 DIAGNOSIS — I2510 Atherosclerotic heart disease of native coronary artery without angina pectoris: Secondary | ICD-10-CM | POA: Diagnosis not present

## 2020-04-27 DIAGNOSIS — E785 Hyperlipidemia, unspecified: Secondary | ICD-10-CM | POA: Diagnosis not present

## 2020-04-27 DIAGNOSIS — E1129 Type 2 diabetes mellitus with other diabetic kidney complication: Secondary | ICD-10-CM | POA: Diagnosis not present

## 2020-05-27 DIAGNOSIS — I2510 Atherosclerotic heart disease of native coronary artery without angina pectoris: Secondary | ICD-10-CM | POA: Diagnosis not present

## 2020-05-27 DIAGNOSIS — E785 Hyperlipidemia, unspecified: Secondary | ICD-10-CM | POA: Diagnosis not present

## 2020-05-27 DIAGNOSIS — E1129 Type 2 diabetes mellitus with other diabetic kidney complication: Secondary | ICD-10-CM | POA: Diagnosis not present

## 2020-05-27 DIAGNOSIS — I25119 Atherosclerotic heart disease of native coronary artery with unspecified angina pectoris: Secondary | ICD-10-CM | POA: Diagnosis not present

## 2020-06-27 DIAGNOSIS — E785 Hyperlipidemia, unspecified: Secondary | ICD-10-CM | POA: Diagnosis not present

## 2020-06-27 DIAGNOSIS — E1129 Type 2 diabetes mellitus with other diabetic kidney complication: Secondary | ICD-10-CM | POA: Diagnosis not present

## 2020-06-27 DIAGNOSIS — I2510 Atherosclerotic heart disease of native coronary artery without angina pectoris: Secondary | ICD-10-CM | POA: Diagnosis not present

## 2020-07-13 DIAGNOSIS — E1169 Type 2 diabetes mellitus with other specified complication: Secondary | ICD-10-CM | POA: Diagnosis not present

## 2020-07-20 DIAGNOSIS — E1169 Type 2 diabetes mellitus with other specified complication: Secondary | ICD-10-CM | POA: Diagnosis not present

## 2020-07-20 DIAGNOSIS — N1832 Chronic kidney disease, stage 3b: Secondary | ICD-10-CM | POA: Diagnosis not present

## 2020-07-20 DIAGNOSIS — I739 Peripheral vascular disease, unspecified: Secondary | ICD-10-CM | POA: Diagnosis not present

## 2020-07-20 DIAGNOSIS — E782 Mixed hyperlipidemia: Secondary | ICD-10-CM | POA: Diagnosis not present

## 2020-07-27 DIAGNOSIS — E782 Mixed hyperlipidemia: Secondary | ICD-10-CM | POA: Diagnosis not present

## 2020-07-27 DIAGNOSIS — N1832 Chronic kidney disease, stage 3b: Secondary | ICD-10-CM | POA: Diagnosis not present

## 2020-07-27 DIAGNOSIS — E1169 Type 2 diabetes mellitus with other specified complication: Secondary | ICD-10-CM | POA: Diagnosis not present

## 2020-08-09 DIAGNOSIS — I739 Peripheral vascular disease, unspecified: Secondary | ICD-10-CM | POA: Diagnosis not present

## 2020-08-09 DIAGNOSIS — Z6830 Body mass index (BMI) 30.0-30.9, adult: Secondary | ICD-10-CM | POA: Diagnosis not present

## 2020-08-27 DIAGNOSIS — N1832 Chronic kidney disease, stage 3b: Secondary | ICD-10-CM | POA: Diagnosis not present

## 2020-08-27 DIAGNOSIS — E1129 Type 2 diabetes mellitus with other diabetic kidney complication: Secondary | ICD-10-CM | POA: Diagnosis not present

## 2020-08-27 DIAGNOSIS — I2510 Atherosclerotic heart disease of native coronary artery without angina pectoris: Secondary | ICD-10-CM | POA: Diagnosis not present

## 2020-09-10 DIAGNOSIS — I743 Embolism and thrombosis of arteries of the lower extremities: Secondary | ICD-10-CM | POA: Diagnosis not present

## 2020-09-10 DIAGNOSIS — I739 Peripheral vascular disease, unspecified: Secondary | ICD-10-CM | POA: Diagnosis not present

## 2020-09-10 DIAGNOSIS — K429 Umbilical hernia without obstruction or gangrene: Secondary | ICD-10-CM | POA: Diagnosis not present

## 2020-09-10 DIAGNOSIS — N281 Cyst of kidney, acquired: Secondary | ICD-10-CM | POA: Diagnosis not present

## 2020-09-10 DIAGNOSIS — N400 Enlarged prostate without lower urinary tract symptoms: Secondary | ICD-10-CM | POA: Diagnosis not present

## 2020-09-20 DIAGNOSIS — N200 Calculus of kidney: Secondary | ICD-10-CM | POA: Diagnosis not present

## 2020-09-27 DIAGNOSIS — I2510 Atherosclerotic heart disease of native coronary artery without angina pectoris: Secondary | ICD-10-CM | POA: Diagnosis not present

## 2020-09-27 DIAGNOSIS — E1129 Type 2 diabetes mellitus with other diabetic kidney complication: Secondary | ICD-10-CM | POA: Diagnosis not present

## 2020-09-27 DIAGNOSIS — N1832 Chronic kidney disease, stage 3b: Secondary | ICD-10-CM | POA: Diagnosis not present

## 2020-10-19 ENCOUNTER — Other Ambulatory Visit: Payer: Self-pay | Admitting: *Deleted

## 2020-10-19 DIAGNOSIS — M25569 Pain in unspecified knee: Secondary | ICD-10-CM

## 2020-10-19 DIAGNOSIS — E1169 Type 2 diabetes mellitus with other specified complication: Secondary | ICD-10-CM | POA: Diagnosis not present

## 2020-10-24 ENCOUNTER — Ambulatory Visit (HOSPITAL_COMMUNITY)
Admission: RE | Admit: 2020-10-24 | Discharge: 2020-10-24 | Disposition: A | Discharge status: Home / Self Care | Payer: Medicare HMO | Source: Ambulatory Visit | Attending: Vascular Surgery | Admitting: Vascular Surgery

## 2020-10-24 ENCOUNTER — Ambulatory Visit: Payer: Medicare HMO | Admitting: Vascular Surgery

## 2020-10-24 ENCOUNTER — Encounter: Payer: Self-pay | Admitting: Vascular Surgery

## 2020-10-24 ENCOUNTER — Other Ambulatory Visit: Payer: Self-pay

## 2020-10-24 VITALS — BP 120/72 | HR 62 | Temp 98.1°F | Resp 20 | Ht 63.0 in | Wt 171.0 lb

## 2020-10-24 DIAGNOSIS — I739 Peripheral vascular disease, unspecified: Secondary | ICD-10-CM

## 2020-10-24 DIAGNOSIS — I70213 Atherosclerosis of native arteries of extremities with intermittent claudication, bilateral legs: Secondary | ICD-10-CM

## 2020-10-24 DIAGNOSIS — M25569 Pain in unspecified knee: Secondary | ICD-10-CM | POA: Diagnosis not present

## 2020-10-24 NOTE — Progress Notes (Signed)
Patient ID: Jeffrey Kelly, male   DOB: 1934-02-20, 86 y.o.   MRN: [id number]  Reason for Consult: New Patient (Initial Visit)   Referred by Marco Collie, MD  Subjective:     HPI:  Jeffrey Kelly is a 86 y.o. male history of open aneurysm repair 1996.  Risk factors include previous smoking history, diabetes, hypertension.  He has had lower extremity angiography performed in the mid 90s as well.  He denies any history of stenting or bypass.  More recently he has bilateral hip pain with any ambulation even as little as 25 to 50 feet.  He does not have any cramping.  He has had his knees replaced in the past has never had hip surgery but thinks he may have degenerative arthritis of both hips.  He does take 81 mg aspirin daily as well as statin.  Past Medical History:  Diagnosis Date   Arthritis    Diabetes mellitus without complication ([place])    Dysrhythmia    GERD (gastroesophageal reflux disease)    History of kidney stones    Hypertension    Myocardial infarction [facility]) 2004   Pneumonia    Shortness of breath dyspnea    with exertion    Urinary tract infection    hx of hopsitalization 10/2013    History reviewed. No pertinent family history. Past Surgical History:  Procedure Laterality Date   ABDOMINAL ANGIOGRAM  1994   ABDOMINAL AORTIC ANEURYSM REPAIR  1996    BACK SURGERY  2002   2008 and 2002    CHOLECYSTECTOMY  2004   COLONOSCOPY  2012    [place]   CORONARY ARTERY BYPASS GRAFT  2004   excision of scrotal lipoma      EYE SURGERY Bilateral    bilateral extraction with iol   HERNIA REPAIR     02/01/2014     left ear surgery   1980   x 2    left hydrocelectomy   01/2014    left knee scope   2007    left spermatocelectomy   01/2014    LITHOTRIPSY  08/21/2014    right knee torn cartilage surgery   1970   scrotal cyst removed   1999   TOTAL KNEE ARTHROPLASTY Left 10/17/2014   Procedure: LEFT TOTAL KNEE ARTHROPLASTY;  Surgeon: Paralee Cancel, MD;  Location: [facility]  ORS;  Service: Orthopedics;  Laterality: Left;   TOTAL KNEE ARTHROPLASTY Right 11/27/2015   Procedure: RIGHT TOTAL KNEE ARTHROPLASTY;  Surgeon: Paralee Cancel, MD;  Location: [facility] ORS;  Service: Orthopedics;  Laterality: Right;   trigger finer release   2011    VASECTOMY  1999    Short Social History:  Social History   Tobacco Use   Smoking status: Former   Smokeless tobacco: Never  Substance Use Topics   Alcohol use: No    Comment: hx of 20 years ago     Allergies  Allergen Reactions   Morphine And Related Nausea And Vomiting   Niacin     Other reaction(s): ITCHING,WATERING EYES   Sertraline     Other reaction(s): Headache   Zinc Oxide     Current Outpatient Medications  Medication Sig Dispense Refill   aspirin 325 MG EC tablet Ecotrin     atorvastatin (LIPITOR) 80 MG tablet Take 40 mg by mouth at bedtime.     B Complex-C (SUPER B COMPLEX PO) Take 1 tablet by mouth every morning.      cetirizine (  ZYRTEC) 10 MG tablet Take 10 mg by mouth every morning.      Hypromellose (ISOPTO TEARS OP) Apply 1 drop to eye daily as needed (dry eyes).     lisinopril (PRINIVIL,ZESTRIL) 10 MG tablet Take 5 mg by mouth every morning.      meclizine (ANTIVERT) 12.5 MG tablet Take 1 tablet (12.5 mg total) by mouth 3 (three) times daily as needed for dizziness. 30 tablet 0   methocarbamol (ROBAXIN) 500 MG tablet Take 1 tablet (500 mg total) by mouth every 6 (six) hours as needed for muscle spasms. (Patient taking differently: Take 500 mg by mouth 2 (two) times daily as needed for muscle spasms.) 40 tablet 0   metoprolol tartrate (LOPRESSOR) 25 MG tablet Take 25 mg by mouth 2 (two) times daily.     polyethylene glycol (MIRALAX / GLYCOLAX) packet Take 17 g by mouth 2 (two) times daily. 14 each 0   Semaglutide, 1 MG/DOSE, (OZEMPIC, 1 MG/DOSE,) 2 MG/1.5ML SOPN Inject into the skin.     Semaglutide-Weight Management 1.7 MG/0.75ML SOAJ INJECT 1MG  SUBCUTANEOUSLY ONCE WEEKLY *APPROVED*     tamsulosin (FLOMAX)  0.4 MG CAPS capsule Take 0.4 mg by mouth daily after breakfast.      HYDROcodone-acetaminophen (NORCO) 5-325 MG tablet Take 1 tablet by mouth every 4 (four) hours as needed for moderate pain. (Patient not taking: Reported on 10/24/2020) 20 tablet 0   insulin NPH-regular Human (NOVOLIN 70/30) (70-30) 100 UNIT/ML injection Inject 50-60 Units into the skin 2 (two) times daily with a meal. TAKES 50 UNITS IN THE MORNING AND 60 AT NIGHT (Patient not taking: Reported on 10/24/2020)     ondansetron (ZOFRAN) 4 MG tablet Take 1 tablet (4 mg total) by mouth every 6 (six) hours. (Patient not taking: Reported on 10/24/2020) 20 tablet 0   ranitidine (ZANTAC) 150 MG tablet Take 150 mg by mouth every morning.  (Patient not taking: Reported on 10/24/2020)     No current facility-administered medications for this visit.    Review of Systems  Constitutional:  Constitutional negative. HENT: HENT negative.  Eyes: Eyes negative.  Respiratory: Respiratory negative.  Cardiovascular: Cardiovascular negative.  GI: Gastrointestinal negative.  Musculoskeletal: Musculoskeletal negative.  Skin: Skin negative.  Neurological: Neurological negative. Hematologic: Hematologic/lymphatic negative.  Psychiatric: Psychiatric negative.       Objective:  Objective   Vitals:   10/24/20 1430  BP: 120/72  Pulse: 62  Resp: 20  Temp: 98.1 F (36.7 C)  SpO2: 96%  Weight: 171 lb (77.6 kg)  Height: 5\' 3"  (1.6 m)   Body mass index is 30.29 kg/m.  Physical Exam HENT:     Head: Normocephalic.     Nose:     Comments: Wearing a mask Neck:     Vascular: No carotid bruit.  Cardiovascular:     Pulses:          Femoral pulses are 2+ on the right side and 2+ on the left side.      Popliteal pulses are 0 on the right side and 2+ on the left side.  Pulmonary:     Effort: Pulmonary effort is normal.  Abdominal:     General: Abdomen is flat.  Musculoskeletal:     Cervical back: Normal range of motion and neck supple.      Right lower leg: No edema.     Left lower leg: No edema.  Neurological:     General: No focal deficit present.     Mental Status: He is  alert. Mental status is at baseline.  Psychiatric:        Mood and Affect: Mood normal.        Behavior: Behavior normal.        Thought Content: Thought content normal.        Judgment: Judgment normal.    Data: ABI Findings:  +---------+------------------+-----+--------+--------+  Right    Rt Pressure (mmHg)IndexWaveformComment   +---------+------------------+-----+--------+--------+  Brachial 130                                      +---------+------------------+-----+--------+--------+  PTA      89                0.68 biphasic          +---------+------------------+-----+--------+--------+  DP       77                0.59 biphasic          +---------+------------------+-----+--------+--------+  Great Toe84                0.64                   +---------+------------------+-----+--------+--------+   +---------+------------------+-----+--------+-------+  Left     Lt Pressure (mmHg)IndexWaveformComment  +---------+------------------+-----+--------+-------+  Brachial 131                                     +---------+------------------+-----+--------+-------+  PTA      120               0.92 biphasic         +---------+------------------+-----+--------+-------+  DP       119               0.91 biphasic         +---------+------------------+-----+--------+-------+  Great Toe74                0.56                  +---------+------------------+-----+--------+-------+   +-------+-----------+-----------+------------+------------+  ABI/TBIToday's ABIToday's TBIPrevious ABIPrevious TBI  +-------+-----------+-----------+------------+------------+  Right  0.68       0.64                                 +-------+-----------+-----------+------------+------------+  Left   0.92        0.56                                 +-------+-----------+-----------+------------+------------+          Assessment/Plan:     86 year old male with bilateral hip pain with minimal ambulation.  He does have easily palpable common femoral pulses we reviewed his previous CT angio today which demonstrates patent common and external neck arteries bilaterally although there is some disease.  He does have distal SFA disease on the right without popliteal pulse on the right but does have a palpable popliteal pulse on the left.  He denies any claudication of his calfs.  We will follow him up in 1 year with repeat ABI bilaterally.     Waynetta Sandy MD [facility]

## 2020-10-27 DIAGNOSIS — N1832 Chronic kidney disease, stage 3b: Secondary | ICD-10-CM | POA: Diagnosis not present

## 2020-10-27 DIAGNOSIS — E1129 Type 2 diabetes mellitus with other diabetic kidney complication: Secondary | ICD-10-CM | POA: Diagnosis not present

## 2020-10-27 DIAGNOSIS — I2510 Atherosclerotic heart disease of native coronary artery without angina pectoris: Secondary | ICD-10-CM | POA: Diagnosis not present

## 2020-11-02 DIAGNOSIS — Z23 Encounter for immunization: Secondary | ICD-10-CM | POA: Diagnosis not present

## 2020-11-02 DIAGNOSIS — I25119 Atherosclerotic heart disease of native coronary artery with unspecified angina pectoris: Secondary | ICD-10-CM | POA: Diagnosis not present

## 2020-11-02 DIAGNOSIS — I739 Peripheral vascular disease, unspecified: Secondary | ICD-10-CM | POA: Diagnosis not present

## 2020-11-02 DIAGNOSIS — E1169 Type 2 diabetes mellitus with other specified complication: Secondary | ICD-10-CM | POA: Diagnosis not present

## 2020-11-02 DIAGNOSIS — E782 Mixed hyperlipidemia: Secondary | ICD-10-CM | POA: Diagnosis not present

## 2020-11-07 DIAGNOSIS — Z23 Encounter for immunization: Secondary | ICD-10-CM | POA: Diagnosis not present

## 2020-11-23 DIAGNOSIS — M25511 Pain in right shoulder: Secondary | ICD-10-CM | POA: Diagnosis not present

## 2020-11-27 DIAGNOSIS — N1832 Chronic kidney disease, stage 3b: Secondary | ICD-10-CM | POA: Diagnosis not present

## 2020-11-27 DIAGNOSIS — E1129 Type 2 diabetes mellitus with other diabetic kidney complication: Secondary | ICD-10-CM | POA: Diagnosis not present

## 2020-11-27 DIAGNOSIS — I2510 Atherosclerotic heart disease of native coronary artery without angina pectoris: Secondary | ICD-10-CM | POA: Diagnosis not present

## 2020-12-05 DIAGNOSIS — Z136 Encounter for screening for cardiovascular disorders: Secondary | ICD-10-CM | POA: Diagnosis not present

## 2020-12-05 DIAGNOSIS — Z0000 Encounter for general adult medical examination without abnormal findings: Secondary | ICD-10-CM | POA: Diagnosis not present

## 2020-12-05 DIAGNOSIS — Z139 Encounter for screening, unspecified: Secondary | ICD-10-CM | POA: Diagnosis not present

## 2020-12-05 DIAGNOSIS — Z1331 Encounter for screening for depression: Secondary | ICD-10-CM | POA: Diagnosis not present

## 2020-12-07 DIAGNOSIS — M545 Low back pain, unspecified: Secondary | ICD-10-CM | POA: Diagnosis not present

## 2020-12-07 DIAGNOSIS — M48062 Spinal stenosis, lumbar region with neurogenic claudication: Secondary | ICD-10-CM | POA: Diagnosis not present

## 2020-12-07 DIAGNOSIS — M5136 Other intervertebral disc degeneration, lumbar region: Secondary | ICD-10-CM | POA: Diagnosis not present

## 2020-12-07 DIAGNOSIS — S39012A Strain of muscle, fascia and tendon of lower back, initial encounter: Secondary | ICD-10-CM | POA: Diagnosis not present

## 2020-12-27 DIAGNOSIS — E1129 Type 2 diabetes mellitus with other diabetic kidney complication: Secondary | ICD-10-CM | POA: Diagnosis not present

## 2020-12-27 DIAGNOSIS — I2510 Atherosclerotic heart disease of native coronary artery without angina pectoris: Secondary | ICD-10-CM | POA: Diagnosis not present

## 2020-12-27 DIAGNOSIS — E782 Mixed hyperlipidemia: Secondary | ICD-10-CM | POA: Diagnosis not present

## 2021-01-02 DIAGNOSIS — Z6829 Body mass index (BMI) 29.0-29.9, adult: Secondary | ICD-10-CM | POA: Diagnosis not present

## 2021-01-02 DIAGNOSIS — J020 Streptococcal pharyngitis: Secondary | ICD-10-CM | POA: Diagnosis not present

## 2021-01-02 DIAGNOSIS — R0981 Nasal congestion: Secondary | ICD-10-CM | POA: Diagnosis not present

## 2021-01-02 DIAGNOSIS — Z20822 Contact with and (suspected) exposure to covid-19: Secondary | ICD-10-CM | POA: Diagnosis not present

## 2021-01-02 DIAGNOSIS — R059 Cough, unspecified: Secondary | ICD-10-CM | POA: Diagnosis not present

## 2021-01-27 DIAGNOSIS — I25119 Atherosclerotic heart disease of native coronary artery with unspecified angina pectoris: Secondary | ICD-10-CM | POA: Diagnosis not present

## 2021-01-27 DIAGNOSIS — E1129 Type 2 diabetes mellitus with other diabetic kidney complication: Secondary | ICD-10-CM | POA: Diagnosis not present

## 2021-01-27 DIAGNOSIS — N1832 Chronic kidney disease, stage 3b: Secondary | ICD-10-CM | POA: Diagnosis not present

## 2021-02-06 DIAGNOSIS — E1169 Type 2 diabetes mellitus with other specified complication: Secondary | ICD-10-CM | POA: Diagnosis not present

## 2021-02-11 DIAGNOSIS — I25119 Atherosclerotic heart disease of native coronary artery with unspecified angina pectoris: Secondary | ICD-10-CM | POA: Diagnosis not present

## 2021-02-11 DIAGNOSIS — E1169 Type 2 diabetes mellitus with other specified complication: Secondary | ICD-10-CM | POA: Diagnosis not present

## 2021-02-11 DIAGNOSIS — N1832 Chronic kidney disease, stage 3b: Secondary | ICD-10-CM | POA: Diagnosis not present

## 2021-02-11 DIAGNOSIS — E782 Mixed hyperlipidemia: Secondary | ICD-10-CM | POA: Diagnosis not present

## 2021-02-27 DIAGNOSIS — I25119 Atherosclerotic heart disease of native coronary artery with unspecified angina pectoris: Secondary | ICD-10-CM | POA: Diagnosis not present

## 2021-02-27 DIAGNOSIS — N1832 Chronic kidney disease, stage 3b: Secondary | ICD-10-CM | POA: Diagnosis not present

## 2021-03-21 DIAGNOSIS — N200 Calculus of kidney: Secondary | ICD-10-CM | POA: Diagnosis not present

## 2021-03-27 DIAGNOSIS — N1832 Chronic kidney disease, stage 3b: Secondary | ICD-10-CM | POA: Diagnosis not present

## 2021-03-27 DIAGNOSIS — I25119 Atherosclerotic heart disease of native coronary artery with unspecified angina pectoris: Secondary | ICD-10-CM | POA: Diagnosis not present

## 2021-04-15 DIAGNOSIS — L821 Other seborrheic keratosis: Secondary | ICD-10-CM | POA: Diagnosis not present

## 2021-04-15 DIAGNOSIS — C44329 Squamous cell carcinoma of skin of other parts of face: Secondary | ICD-10-CM | POA: Diagnosis not present

## 2021-04-15 DIAGNOSIS — L219 Seborrheic dermatitis, unspecified: Secondary | ICD-10-CM | POA: Diagnosis not present

## 2021-04-15 DIAGNOSIS — L578 Other skin changes due to chronic exposure to nonionizing radiation: Secondary | ICD-10-CM | POA: Diagnosis not present

## 2021-04-27 DIAGNOSIS — E1122 Type 2 diabetes mellitus with diabetic chronic kidney disease: Secondary | ICD-10-CM | POA: Diagnosis not present

## 2021-04-27 DIAGNOSIS — I739 Peripheral vascular disease, unspecified: Secondary | ICD-10-CM | POA: Diagnosis not present

## 2021-05-27 DIAGNOSIS — E1122 Type 2 diabetes mellitus with diabetic chronic kidney disease: Secondary | ICD-10-CM | POA: Diagnosis not present

## 2021-05-27 DIAGNOSIS — I739 Peripheral vascular disease, unspecified: Secondary | ICD-10-CM | POA: Diagnosis not present

## 2021-06-06 DIAGNOSIS — E1169 Type 2 diabetes mellitus with other specified complication: Secondary | ICD-10-CM | POA: Diagnosis not present

## 2021-06-13 DIAGNOSIS — E1169 Type 2 diabetes mellitus with other specified complication: Secondary | ICD-10-CM | POA: Diagnosis not present

## 2021-06-13 DIAGNOSIS — E1122 Type 2 diabetes mellitus with diabetic chronic kidney disease: Secondary | ICD-10-CM | POA: Diagnosis not present

## 2021-06-13 DIAGNOSIS — E782 Mixed hyperlipidemia: Secondary | ICD-10-CM | POA: Diagnosis not present

## 2021-06-13 DIAGNOSIS — N1832 Chronic kidney disease, stage 3b: Secondary | ICD-10-CM | POA: Diagnosis not present

## 2021-06-27 DIAGNOSIS — I739 Peripheral vascular disease, unspecified: Secondary | ICD-10-CM | POA: Diagnosis not present

## 2021-06-27 DIAGNOSIS — E1122 Type 2 diabetes mellitus with diabetic chronic kidney disease: Secondary | ICD-10-CM | POA: Diagnosis not present

## 2021-07-27 DIAGNOSIS — I739 Peripheral vascular disease, unspecified: Secondary | ICD-10-CM | POA: Diagnosis not present

## 2021-07-27 DIAGNOSIS — E1122 Type 2 diabetes mellitus with diabetic chronic kidney disease: Secondary | ICD-10-CM | POA: Diagnosis not present

## 2021-08-16 DIAGNOSIS — M25511 Pain in right shoulder: Secondary | ICD-10-CM | POA: Diagnosis not present

## 2021-08-27 DIAGNOSIS — I739 Peripheral vascular disease, unspecified: Secondary | ICD-10-CM | POA: Diagnosis not present

## 2021-08-27 DIAGNOSIS — E1122 Type 2 diabetes mellitus with diabetic chronic kidney disease: Secondary | ICD-10-CM | POA: Diagnosis not present

## 2021-09-02 DIAGNOSIS — E1169 Type 2 diabetes mellitus with other specified complication: Secondary | ICD-10-CM | POA: Diagnosis not present

## 2021-09-02 DIAGNOSIS — N400 Enlarged prostate without lower urinary tract symptoms: Secondary | ICD-10-CM | POA: Diagnosis not present

## 2021-09-02 DIAGNOSIS — E1122 Type 2 diabetes mellitus with diabetic chronic kidney disease: Secondary | ICD-10-CM | POA: Diagnosis not present

## 2021-09-09 DIAGNOSIS — N1832 Chronic kidney disease, stage 3b: Secondary | ICD-10-CM | POA: Diagnosis not present

## 2021-09-09 DIAGNOSIS — I739 Peripheral vascular disease, unspecified: Secondary | ICD-10-CM | POA: Diagnosis not present

## 2021-09-09 DIAGNOSIS — Z6830 Body mass index (BMI) 30.0-30.9, adult: Secondary | ICD-10-CM | POA: Diagnosis not present

## 2021-09-09 DIAGNOSIS — E1169 Type 2 diabetes mellitus with other specified complication: Secondary | ICD-10-CM | POA: Diagnosis not present

## 2021-09-09 DIAGNOSIS — E782 Mixed hyperlipidemia: Secondary | ICD-10-CM | POA: Diagnosis not present

## 2021-09-19 DIAGNOSIS — N200 Calculus of kidney: Secondary | ICD-10-CM | POA: Diagnosis not present

## 2021-09-27 DIAGNOSIS — E1122 Type 2 diabetes mellitus with diabetic chronic kidney disease: Secondary | ICD-10-CM | POA: Diagnosis not present

## 2021-09-27 DIAGNOSIS — I739 Peripheral vascular disease, unspecified: Secondary | ICD-10-CM | POA: Diagnosis not present

## 2021-10-27 DIAGNOSIS — E1122 Type 2 diabetes mellitus with diabetic chronic kidney disease: Secondary | ICD-10-CM | POA: Diagnosis not present

## 2021-10-27 DIAGNOSIS — I739 Peripheral vascular disease, unspecified: Secondary | ICD-10-CM | POA: Diagnosis not present

## 2021-11-07 DIAGNOSIS — Z23 Encounter for immunization: Secondary | ICD-10-CM | POA: Diagnosis not present

## 2021-11-21 DIAGNOSIS — H1030 Unspecified acute conjunctivitis, unspecified eye: Secondary | ICD-10-CM | POA: Diagnosis not present

## 2021-11-21 DIAGNOSIS — Z6830 Body mass index (BMI) 30.0-30.9, adult: Secondary | ICD-10-CM | POA: Diagnosis not present

## 2021-11-21 DIAGNOSIS — Z23 Encounter for immunization: Secondary | ICD-10-CM | POA: Diagnosis not present

## 2021-11-25 DIAGNOSIS — M25511 Pain in right shoulder: Secondary | ICD-10-CM | POA: Diagnosis not present

## 2021-11-27 DIAGNOSIS — E1151 Type 2 diabetes mellitus with diabetic peripheral angiopathy without gangrene: Secondary | ICD-10-CM | POA: Diagnosis not present

## 2021-11-27 DIAGNOSIS — E1169 Type 2 diabetes mellitus with other specified complication: Secondary | ICD-10-CM | POA: Diagnosis not present

## 2021-12-12 DIAGNOSIS — N1832 Chronic kidney disease, stage 3b: Secondary | ICD-10-CM | POA: Diagnosis not present

## 2021-12-12 DIAGNOSIS — E1169 Type 2 diabetes mellitus with other specified complication: Secondary | ICD-10-CM | POA: Diagnosis not present

## 2021-12-13 DIAGNOSIS — M25511 Pain in right shoulder: Secondary | ICD-10-CM | POA: Diagnosis not present

## 2021-12-16 DIAGNOSIS — N1832 Chronic kidney disease, stage 3b: Secondary | ICD-10-CM | POA: Diagnosis not present

## 2021-12-16 DIAGNOSIS — Z6830 Body mass index (BMI) 30.0-30.9, adult: Secondary | ICD-10-CM | POA: Diagnosis not present

## 2021-12-16 DIAGNOSIS — E1169 Type 2 diabetes mellitus with other specified complication: Secondary | ICD-10-CM | POA: Diagnosis not present

## 2021-12-16 DIAGNOSIS — E782 Mixed hyperlipidemia: Secondary | ICD-10-CM | POA: Diagnosis not present

## 2021-12-18 DIAGNOSIS — Z136 Encounter for screening for cardiovascular disorders: Secondary | ICD-10-CM | POA: Diagnosis not present

## 2021-12-18 DIAGNOSIS — Z139 Encounter for screening, unspecified: Secondary | ICD-10-CM | POA: Diagnosis not present

## 2021-12-18 DIAGNOSIS — Z1339 Encounter for screening examination for other mental health and behavioral disorders: Secondary | ICD-10-CM | POA: Diagnosis not present

## 2021-12-18 DIAGNOSIS — Z0000 Encounter for general adult medical examination without abnormal findings: Secondary | ICD-10-CM | POA: Diagnosis not present

## 2021-12-18 DIAGNOSIS — Z6829 Body mass index (BMI) 29.0-29.9, adult: Secondary | ICD-10-CM | POA: Diagnosis not present

## 2021-12-18 DIAGNOSIS — Z1331 Encounter for screening for depression: Secondary | ICD-10-CM | POA: Diagnosis not present

## 2021-12-25 DIAGNOSIS — M25811 Other specified joint disorders, right shoulder: Secondary | ICD-10-CM | POA: Diagnosis not present

## 2021-12-27 DIAGNOSIS — E1122 Type 2 diabetes mellitus with diabetic chronic kidney disease: Secondary | ICD-10-CM | POA: Diagnosis not present

## 2021-12-27 DIAGNOSIS — I739 Peripheral vascular disease, unspecified: Secondary | ICD-10-CM | POA: Diagnosis not present

## 2022-01-02 DIAGNOSIS — I25119 Atherosclerotic heart disease of native coronary artery with unspecified angina pectoris: Secondary | ICD-10-CM | POA: Diagnosis not present

## 2022-01-02 DIAGNOSIS — Z6829 Body mass index (BMI) 29.0-29.9, adult: Secondary | ICD-10-CM | POA: Diagnosis not present

## 2022-01-02 DIAGNOSIS — Z9181 History of falling: Secondary | ICD-10-CM | POA: Diagnosis not present

## 2022-01-02 DIAGNOSIS — N1832 Chronic kidney disease, stage 3b: Secondary | ICD-10-CM | POA: Diagnosis not present

## 2022-01-02 DIAGNOSIS — M75100 Unspecified rotator cuff tear or rupture of unspecified shoulder, not specified as traumatic: Secondary | ICD-10-CM | POA: Diagnosis not present

## 2022-01-07 DIAGNOSIS — Z01810 Encounter for preprocedural cardiovascular examination: Secondary | ICD-10-CM | POA: Diagnosis not present

## 2022-01-07 DIAGNOSIS — R079 Chest pain, unspecified: Secondary | ICD-10-CM | POA: Diagnosis not present

## 2022-01-07 DIAGNOSIS — I25119 Atherosclerotic heart disease of native coronary artery with unspecified angina pectoris: Secondary | ICD-10-CM | POA: Diagnosis not present

## 2022-01-13 NOTE — Progress Notes (Signed)
Surgery orders requested via Epic inbox. °

## 2022-01-15 NOTE — Patient Instructions (Addendum)
SURGICAL WAITING ROOM VISITATION  Patients having surgery or a procedure may have no more than 2 support people in the waiting area - these visitors may rotate.    Children under the age of 110 must have an adult with them who is not the patient.  Due to an increase in RSV and influenza rates and associated hospitalizations, children ages 73 and under may not visit patients in [place].  If the patient needs to stay at the hospital during part of their recovery, the visitor guidelines for inpatient rooms apply. Pre-op nurse will coordinate an appropriate time for 1 support person to accompany patient in pre-op.  This support person may not rotate.    Please refer to the [facility] website for the visitor guidelines for Inpatients (after your surgery is over and you are in a regular room).    Your procedure is scheduled on: 02/06/22   Report to [facility] Main Entrance    Report to admitting at 10:00 AM   Call this number if you have problems the morning of surgery [phone]   Do not eat food :After Midnight.   After Midnight you may have the following liquids until 9:30 AM DAY OF SURGERY  Water Non-Citrus Juices (without pulp, NO RED-Apple, White grape, White cranberry) Black Coffee (NO MILK/CREAM OR CREAMERS, sugar ok)  Clear Tea (NO MILK/CREAM OR CREAMERS, sugar ok) regular and decaf                             Plain Jell-O (NO RED)                                           Fruit ices (not with fruit pulp, NO RED)                                     Popsicles (NO RED)                                                               Sports drinks like Gatorade (NO RED)   The day of surgery:  Drink ONE (1) Pre-Surgery G2 at 9:30 AM the morning of surgery. Drink in one sitting. Do not sip.  This drink was given to you during your hospital  pre-op appointment visit. Nothing else to drink after completing the  Pre-Surgery G2.          If you have questions,  please contact your surgeon's office.   FOLLOW BOWEL PREP AND ANY ADDITIONAL PRE OP INSTRUCTIONS YOU RECEIVED FROM YOUR SURGEON'S OFFICE!!!     Oral Hygiene is also important to reduce your risk of infection.                                    Remember - BRUSH YOUR TEETH THE MORNING OF SURGERY WITH YOUR REGULAR TOOTHPASTE  DENTURES WILL BE REMOVED PRIOR TO SURGERY PLEASE DO NOT APPLY "Poly grip" OR ADHESIVES!!!   Take  these medicines the morning of surgery with A SIP OF WATER: Tylenol, Zyrtec, Metoprolol, Tamsulosin   DO NOT TAKE ANY ORAL DIABETIC MEDICATIONS DAY OF YOUR SURGERY  How to Manage Your Diabetes Before and After Surgery  Why is it important to control my blood sugar before and after surgery? Improving blood sugar levels before and after surgery helps healing and can limit problems. A way of improving blood sugar control is eating a healthy diet by:  Eating less sugar and carbohydrates  Increasing activity/exercise  Talking with your doctor about reaching your blood sugar goals High blood sugars (greater than 180 mg/dL) can raise your risk of infections and slow your recovery, so you will need to focus on controlling your diabetes during the weeks before surgery. Make sure that the doctor who takes care of your diabetes knows about your planned surgery including the date and location.  How do I manage my blood sugar before surgery? Check your blood sugar at least 4 times a day, starting 2 days before surgery, to make sure that the level is not too high or low. Check your blood sugar the morning of your surgery when you wake up and every 2 hours until you get to the Short Stay unit. If your blood sugar is less than 70 mg/dL, you will need to treat for low blood sugar: Do not take insulin. Treat a low blood sugar (less than 70 mg/dL) with  cup of clear juice (cranberry or apple), 4 glucose tablets, OR glucose gel. Recheck blood sugar in 15 minutes after treatment (to make  sure it is greater than 70 mg/dL). If your blood sugar is not greater than 70 mg/dL on recheck, call [phone] for further instructions. Report your blood sugar to the short stay nurse when you get to Short Stay.  If you are admitted to the hospital after surgery: Your blood sugar will be checked by the staff and you will probably be given insulin after surgery (instead of oral diabetes medicines) to make sure you have good blood sugar levels. The goal for blood sugar control after surgery is 80-180 mg/dL.   WHAT DO I DO ABOUT MY DIABETES MEDICATION?  Do not take oral diabetes medicines (pills) the morning of surgery.  Do not take Ozempic 02/02/22. May resume as normal after surgery.   Hold Farxiga for 72 hours before surgery. Last dose 02/02/22.  THE DAY BEFORE SURGERY, take Insulin as prescribed.      THE MORNING OF SURGERY, take 50 % of Insulin.  DO NOT TAKE THE FOLLOWING 7 DAYS PRIOR TO SURGERY: Ozempic, Wegovy, Rybelsus (Semaglutide), Byetta (exenatide), Bydureon (exenatide ER), Victoza, Saxenda (liraglutide), or Trulicity (dulaglutide) Mounjaro (Tirzepatide) Adlyxin (Lixisenatide), Polyethylene Glycol Loxenatide.  Reviewed and Endorsed by [facility] Patient Education Committee, August 2015                              You may not have any metal on your body including jewelry, and body piercing             Do not wear lotions, powders, cologne, or deodorant              Men may shave face and neck.   Do not bring valuables to the hospital. [place].   Contacts, glasses, dentures or bridgework may not be worn  into surgery.  DO NOT [place]. PHARMACY WILL DISPENSE MEDICATIONS LISTED ON YOUR MEDICATION LIST TO YOU DURING YOUR ADMISSION [place]!    Patients discharged on the day of surgery will not be allowed to drive home.  Someone NEEDS to stay with you for the first 24 hours after  anesthesia.   Special Instructions: Bring a copy of your healthcare power of attorney and living will documents the day of surgery if you haven't scanned them before.              Please read over the following fact sheets you were given: IF Jeffrey Kelly [name]    If you received a COVID test during your pre-op visit  it is requested that you wear a mask when out in public, stay away from anyone that may not be feeling well and notify your surgeon if you develop symptoms. If you test positive for Covid or have been in contact with anyone that has tested positive in the last 10 days please notify you surgeon.    Jeffrey Kelly - Preparing for Surgery Before surgery, you can play an important role.  Because skin is not sterile, your skin needs to be as free of germs as possible.  You can reduce the number of germs on your skin by washing with CHG (chlorahexidine gluconate) soap before surgery.  CHG is an antiseptic cleaner which kills germs and bonds with the skin to continue killing germs even after washing. Please DO NOT use if you have an allergy to CHG or antibacterial soaps.  If your skin becomes reddened/irritated stop using the CHG and inform your nurse when you arrive at Short Stay. Do not shave (including legs and underarms) for at least 48 hours prior to the first CHG shower.  You may shave your face/neck.  Please follow these instructions carefully:  1.  Shower with CHG Soap the night before surgery and the  morning of surgery.  2.  If you choose to wash your hair, wash your hair first as usual with your normal  shampoo.  3.  After you shampoo, rinse your hair and body thoroughly to remove the shampoo.                             4.  Use CHG as you would any other liquid soap.  You can apply chg directly to the skin and wash.  Gently with a scrungie or clean washcloth.  5.  Apply the CHG Soap to your body ONLY FROM THE NECK DOWN.    Do   not use on face/ open                           Wound or open sores. Avoid contact with eyes, ears mouth and   genitals (private parts).                       Wash face,  Genitals (private parts) with your normal soap.             6.  Wash thoroughly, paying special attention to the area where your    surgery  will be performed.  7.  Thoroughly rinse your body with warm water from the neck down.  8.  DO NOT shower/wash with your normal soap  after using and rinsing off the CHG Soap.                9.  Pat yourself dry with a clean towel.            10.  Wear clean pajamas.            11.  Place clean sheets on your bed the night of your first shower and do not  sleep with pets. Day of Surgery : Do not apply any lotions/deodorants the morning of surgery.  Please wear clean clothes to the hospital/surgery center.  FAILURE TO FOLLOW THESE INSTRUCTIONS MAY RESULT IN THE CANCELLATION OF YOUR SURGERY  PATIENT SIGNATURE_________________________________  NURSE SIGNATURE__________________________________  ________________________________________________________________________  Jeffrey Kelly  An incentive spirometer is a tool that can help keep your lungs clear and active. This tool measures how well you are filling your lungs with each breath. Taking long deep breaths may help reverse or decrease the chance of developing breathing (pulmonary) problems (especially infection) following: A long period of time when you are unable to move or be active. BEFORE THE PROCEDURE  If the spirometer includes an indicator to show your best effort, your nurse or respiratory therapist will set it to a desired goal. If possible, sit up straight or lean slightly forward. Try not to slouch. Hold the incentive spirometer in an upright position. INSTRUCTIONS FOR USE  Sit on the edge of your bed if possible, or sit up as far as you can in bed or on a chair. Hold the incentive spirometer in an upright  position. Breathe out normally. Place the mouthpiece in your mouth and seal your lips tightly around it. Breathe in slowly and as deeply as possible, raising the piston or the ball toward the top of the column. Hold your breath for 3-5 seconds or for as long as possible. Allow the piston or ball to fall to the bottom of the column. Remove the mouthpiece from your mouth and breathe out normally. Rest for a few seconds and repeat Steps 1 through 7 at least 10 times every 1-2 hours when you are awake. Take your time and take a few normal breaths between deep breaths. The spirometer may include an indicator to show your best effort. Use the indicator as a goal to work toward during each repetition. After each set of 10 deep breaths, practice coughing to be sure your lungs are clear. If you have an incision (the cut made at the time of surgery), support your incision when coughing by placing a pillow or rolled up towels firmly against it. Once you are able to get out of bed, walk around indoors and cough well. You may stop using the incentive spirometer when instructed by your caregiver.  RISKS AND COMPLICATIONS Take your time so you do not get dizzy or light-headed. If you are in pain, you may need to take or ask for pain medication before doing incentive spirometry. It is harder to take a deep breath if you are having pain. AFTER USE Rest and breathe slowly and easily. It can be helpful to keep track of a log of your progress. Your caregiver can provide you with a simple table to help with this. If you are using the spirometer at home, follow these instructions: [place] IF:  You are having difficultly using the spirometer. You have trouble using the spirometer as often as instructed. Your pain medication is not giving enough relief while using the spirometer. You  develop fever of 100.5 F (38.1 C) or higher. SEEK IMMEDIATE MEDICAL CARE IF:  You cough up bloody sputum that had not been  present before. You develop fever of 102 F (38.9 C) or greater. You develop worsening pain at or near the incision site. MAKE SURE YOU:  Understand these instructions. Will watch your condition. Will get help right away if you are not doing well or get worse. Document Released: 05/26/2006 Document Revised: 04/07/2011 Document Reviewed: 07/27/2006 ExitCare Patient Information 2014 [place].   ________________________________________________________________________  [facility]- Preparing for Total Shoulder Arthroplasty    Before surgery, you can play an important role. Because skin is not sterile, your skin needs to be as free of germs as possible. You can reduce the number of germs on your skin by using the following products. Benzoyl Peroxide Gel Reduces the number of germs present on the skin Applied twice a day to shoulder area starting two days before surgery    ==================================================================  Please follow these instructions carefully:  BENZOYL PEROXIDE 5% GEL  Please do not use if you have an allergy to benzoyl peroxide.   If your skin becomes reddened/irritated stop using the benzoyl peroxide.  Starting two days before surgery, apply as follows: Apply benzoyl peroxide in the morning and at night. Apply after taking a shower. If you are not taking a shower clean entire shoulder front, back, and side along with the armpit with a clean wet washcloth.  Place a quarter-sized dollop on your shoulder and rub in thoroughly, making sure to cover the front, back, and side of your shoulder, along with the armpit.   2 days before ____ AM   ____ PM              1 day before ____ AM   ____ PM                         Do this twice a day for two days.  (Last application is the night before surgery, AFTER using the CHG soap as described below).  Do NOT apply benzoyl peroxide gel on the day of surgery.

## 2022-01-15 NOTE — Progress Notes (Addendum)
COVID Vaccine Completed: yes  Date of COVID positive in last 90 days: no  PCP - Marco Collie, MD Cardiologist - n/a  Medical/cardiac clearance by Marco Collie 01/09/22 on chart  Chest x-ray - n/a EKG - yes per pt this month Stress Test - 01/07/22 on chart  ECHO - pt thinks so, unsure Cardiac Cath - yes per pt Pacemaker/ICD device last checked: n/a Spinal Cord Stimulator: n/a  Bowel Prep - n/a  Sleep Study - n/a CPAP -   Fasting Blood Sugar - 100-160 Checks Blood Sugar 1 times a day  Last dose of GLP1 agonist-  Ozempic, pt takes on Sundays  GLP1 instructions:  hold 02/02/22, last dose 01/26/22   Last dose of SGLT-2 inhibitors-  N/A SGLT-2 instructions: N/A   Blood Thinner Instructions: Aspirin Instructions: ASA 81, hold 7 days Last Dose:  Activity level: Can go up a flight of stairs and perform activities of daily living without stopping and without symptoms of chest pain or shortness of breath.   Anesthesia review:  CP, CABG, CAD, DM2, CKD, glucose 270, creatinine 1.73  Patient denies shortness of breath, fever, cough and chest pain at PAT appointment  Patient verbalized understanding of instructions that were given to them at the PAT appointment. Patient was also instructed that they will need to review over the PAT instructions again at home before surgery.

## 2022-01-17 ENCOUNTER — Encounter (HOSPITAL_COMMUNITY): Payer: Self-pay

## 2022-01-17 ENCOUNTER — Encounter (HOSPITAL_COMMUNITY)
Admission: RE | Admit: 2022-01-17 | Discharge: 2022-01-17 | Disposition: A | Discharge status: Home / Self Care | Payer: Medicare HMO | Source: Ambulatory Visit | Attending: Orthopedic Surgery | Admitting: Orthopedic Surgery

## 2022-01-17 VITALS — BP 137/65 | HR 66 | Temp 97.7°F | Resp 12 | Ht 64.0 in | Wt 168.0 lb

## 2022-01-17 DIAGNOSIS — Z01818 Encounter for other preprocedural examination: Secondary | ICD-10-CM | POA: Diagnosis not present

## 2022-01-17 DIAGNOSIS — E119 Type 2 diabetes mellitus without complications: Secondary | ICD-10-CM | POA: Diagnosis not present

## 2022-01-17 DIAGNOSIS — Z794 Long term (current) use of insulin: Secondary | ICD-10-CM | POA: Insufficient documentation

## 2022-01-17 HISTORY — DX: Atherosclerotic heart disease of native coronary artery without angina pectoris: I25.10

## 2022-01-17 HISTORY — DX: Malignant (primary) neoplasm, unspecified: C80.1

## 2022-01-17 LAB — CBC
HCT: 44.9 % (ref 39.0–52.0)
Hemoglobin: 14.6 g/dL (ref 13.0–17.0)
MCH: 30.8 pg (ref 26.0–34.0)
MCHC: 32.5 g/dL (ref 30.0–36.0)
MCV: 94.7 fL (ref 80.0–100.0)
Platelets: 152 10*3/uL (ref 150–400)
RBC: 4.74 MIL/uL (ref 4.22–5.81)
RDW: 12.7 % (ref 11.5–15.5)
WBC: 6.8 10*3/uL (ref 4.0–10.5)
nRBC: 0 % (ref 0.0–0.2)

## 2022-01-17 LAB — BASIC METABOLIC PANEL
Anion gap: 9 (ref 5–15)
BUN: 26 mg/dL — ABNORMAL HIGH (ref 8–23)
CO2: 23 mmol/L (ref 22–32)
Calcium: 9.2 mg/dL (ref 8.9–10.3)
Chloride: 104 mmol/L (ref 98–111)
Creatinine, Ser: 1.73 mg/dL — ABNORMAL HIGH (ref 0.61–1.24)
GFR, Estimated: 38 mL/min — ABNORMAL LOW (ref >60)
Glucose, Bld: 270 mg/dL — ABNORMAL HIGH (ref 70–99)
Potassium: 4.5 mmol/L (ref 3.5–5.1)
Sodium: 136 mmol/L (ref 135–145)

## 2022-01-17 LAB — SURGICAL PCR SCREEN
MRSA, PCR: NEGATIVE
Staphylococcus aureus: NEGATIVE

## 2022-01-17 LAB — GLUCOSE, CAPILLARY: Glucose-Capillary: 277 mg/dL — ABNORMAL HIGH (ref 70–99)

## 2022-01-17 NOTE — Progress Notes (Signed)
Glucose 270, creatinine 1.73, results toured to Dr. Onnie Graham

## 2022-01-27 DIAGNOSIS — E1122 Type 2 diabetes mellitus with diabetic chronic kidney disease: Secondary | ICD-10-CM | POA: Diagnosis not present

## 2022-01-27 DIAGNOSIS — I739 Peripheral vascular disease, unspecified: Secondary | ICD-10-CM | POA: Diagnosis not present

## 2022-01-30 ENCOUNTER — Ambulatory Visit: Payer: Medicare HMO | Admitting: Cardiology

## 2022-01-30 NOTE — Progress Notes (Signed)
Anesthesia Chart Review   Case: [id number] Date/Time: 02/06/22 1215   Procedure: REVERSE SHOULDER ARTHROPLASTY (Right: Shoulder) - 119mn   Anesthesia type: General   Pre-op diagnosis: Right shoulder rotator cuff tear arthropathy   Location: [facility]OM 06 / [facility]   Surgeons: SJustice Britain MD       DISCUSSION:88 y.o. former smoker with h/o CAD (3 vessel bypass 20 years ago), HTN, DM II, right shoulder OA scheduled for above procedure 02/06/2022 with Dr. KJustice Britain   Pt seen by PCP 01/03/2022 for preoperative evaluation.  Per OV note no recent angina.  Stress test ordered.  Stress test 01/07/2022 with no ischemia.   Anticipate pt can proceed with planned procedure barring acute status change.   VS: There were no vitals taken for this visit.  PROVIDERS: HMarco Collie MD is PCP    LABS: Labs reviewed: Acceptable for surgery. (all labs ordered are listed, but only abnormal results are displayed)  Labs Reviewed - No data to display   IMAGES:   EKG:   CV: Myocardial Perfusion 01/07/2022 Uneventful Lexiscan  Past Medical History:  Diagnosis Date   Arthritis    Cancer ([place]    skin   Coronary artery disease    Diabetes mellitus without complication ([facility]    Dysrhythmia    GERD (gastroesophageal reflux disease)    History of kidney stones    Hypertension    [place]ocardial infarction [place]   Pneumonia    Shortness of breath dyspnea    with exertion    Urinary tract infection    hx of hopsitalization 10/2013     Past Surgical History:  Procedure Laterality Date   [place]DOMINAL ANGIOGRAM  [facility]  [place]   2008 and 2002    CHOLECYSTECTOMY  2004   COLONOSCOPY  2012    [place]  [place]   excision of scrotal lipoma      EYE SURGERY Bilateral    bilateral extraction with iol   HERNIA REPAIR     02/01/2014     left ear surgery   1980   x 2    left hydrocelectomy    01/2014    left knee scope   2007    left spermatocelectomy   01/2014    LITHOTRIPSY  08/21/2014    right knee torn cartilage surgery   1970   scrotal cyst removed   1999   TOTAL KNEE ARTHROPLASTY Left 10/17/2014   Procedure: LEFT TOTAL KNEE ARTHROPLASTY;  Surgeon: MParalee Cancel MD;  Location[facility];  Service: Orthopedics;  Laterality: Left;   TOTAL KNEE ARTHROPLASTY Right 11/27/2015   Procedure: RIGHT TOTAL KNEE ARTHROPLASTY;  Surgeon: MParalee Cancel MD;  Location[facility];  Service: Orthopedics;  Laterality: Right;   trigger finer release   2011    VASECTOMY  1999    MEDICATIONS: No current facility-administered medications for this encounter.    acetaminophen (TYLENOL) 650 MG CR tablet   aspirin EC 81 MG tablet   atorvastatin (LIPITOR) 40 MG tablet   B Complex-C (SUPER B COMPLEX PO)   cetirizine (ZYRTEC) 10 MG tablet   dapagliflozin propanediol (FARXIGA) 10 MG TABS tablet   diclofenac Sodium (VOLTAREN) 1 % GEL   Glycerin-Hypromellose-PEG 400 (DRY EYE RELIEF DROPS) 0.2-0.2-1 % SOLN   insulin glargine-yfgn (SEMGLEE, YFGN,) 100 UNIT/ML injection   lisinopril (ZESTRIL) 5 MG tablet   methocarbamol (  ROBAXIN) 500 MG tablet   metoprolol tartrate (LOPRESSOR) 25 MG tablet   polyethylene glycol (MIRALAX / GLYCOLAX) packet   Semaglutide, 1 MG/DOSE, (OZEMPIC, 1 MG/DOSE,) 2 MG/1.5ML SOPN   tamsulosin (FLOMAX) 0.4 MG CAPS capsule   Konrad Felix Ward, PA-C [facility] [phone]

## 2022-02-06 ENCOUNTER — Ambulatory Visit (HOSPITAL_COMMUNITY): Payer: Medicare HMO | Admitting: Physician Assistant

## 2022-02-06 ENCOUNTER — Encounter (HOSPITAL_COMMUNITY): Payer: Self-pay | Admitting: Orthopedic Surgery

## 2022-02-06 ENCOUNTER — Ambulatory Visit (HOSPITAL_COMMUNITY)
Admission: RE | Admit: 2022-02-06 | Discharge: 2022-02-06 | Disposition: A | Discharge status: Home / Self Care | Payer: Medicare HMO | Source: Ambulatory Visit | Attending: Orthopedic Surgery | Admitting: Orthopedic Surgery

## 2022-02-06 ENCOUNTER — Encounter (HOSPITAL_COMMUNITY)
Admission: RE | Disposition: A | Discharge status: Home / Self Care | Payer: Self-pay | Source: Ambulatory Visit | Attending: Orthopedic Surgery

## 2022-02-06 ENCOUNTER — Other Ambulatory Visit: Payer: Self-pay

## 2022-02-06 DIAGNOSIS — Z7984 Long term (current) use of oral hypoglycemic drugs: Secondary | ICD-10-CM | POA: Insufficient documentation

## 2022-02-06 DIAGNOSIS — Z794 Long term (current) use of insulin: Secondary | ICD-10-CM | POA: Diagnosis not present

## 2022-02-06 DIAGNOSIS — K219 Gastro-esophageal reflux disease without esophagitis: Secondary | ICD-10-CM | POA: Diagnosis not present

## 2022-02-06 DIAGNOSIS — I10 Essential (primary) hypertension: Secondary | ICD-10-CM

## 2022-02-06 DIAGNOSIS — I2510 Atherosclerotic heart disease of native coronary artery without angina pectoris: Secondary | ICD-10-CM | POA: Insufficient documentation

## 2022-02-06 DIAGNOSIS — Z87891 Personal history of nicotine dependence: Secondary | ICD-10-CM | POA: Insufficient documentation

## 2022-02-06 DIAGNOSIS — G8918 Other acute postprocedural pain: Secondary | ICD-10-CM | POA: Diagnosis not present

## 2022-02-06 DIAGNOSIS — I252 Old myocardial infarction: Secondary | ICD-10-CM | POA: Insufficient documentation

## 2022-02-06 DIAGNOSIS — M12811 Other specific arthropathies, not elsewhere classified, right shoulder: Secondary | ICD-10-CM

## 2022-02-06 DIAGNOSIS — E1151 Type 2 diabetes mellitus with diabetic peripheral angiopathy without gangrene: Secondary | ICD-10-CM | POA: Diagnosis not present

## 2022-02-06 DIAGNOSIS — Z951 Presence of aortocoronary bypass graft: Secondary | ICD-10-CM | POA: Diagnosis not present

## 2022-02-06 DIAGNOSIS — E119 Type 2 diabetes mellitus without complications: Secondary | ICD-10-CM

## 2022-02-06 DIAGNOSIS — Z79899 Other long term (current) drug therapy: Secondary | ICD-10-CM | POA: Insufficient documentation

## 2022-02-06 DIAGNOSIS — M19011 Primary osteoarthritis, right shoulder: Secondary | ICD-10-CM | POA: Diagnosis not present

## 2022-02-06 DIAGNOSIS — M75101 Unspecified rotator cuff tear or rupture of right shoulder, not specified as traumatic: Secondary | ICD-10-CM | POA: Insufficient documentation

## 2022-02-06 DIAGNOSIS — Z7985 Long-term (current) use of injectable non-insulin antidiabetic drugs: Secondary | ICD-10-CM | POA: Diagnosis not present

## 2022-02-06 HISTORY — PX: REVERSE SHOULDER ARTHROPLASTY: SHX5054

## 2022-02-06 LAB — GLUCOSE, CAPILLARY
Glucose-Capillary: 105 mg/dL — ABNORMAL HIGH (ref 70–99)
Glucose-Capillary: 130 mg/dL — ABNORMAL HIGH (ref 70–99)

## 2022-02-06 SURGERY — ARTHROPLASTY, SHOULDER, TOTAL, REVERSE
Anesthesia: General | Site: Shoulder | Laterality: Right

## 2022-02-06 MED ORDER — METOCLOPRAMIDE HCL 5 MG PO TABS: 5.0000 mg | ORAL_TABLET | Freq: Three times a day (TID) | ORAL | Status: DC | PRN

## 2022-02-06 MED ORDER — ONDANSETRON HCL 4 MG PO TABS: 4.0000 mg | ORAL_TABLET | Freq: Four times a day (QID) | ORAL | Status: DC | PRN

## 2022-02-06 MED ORDER — LACTATED RINGERS IV BOLUS: 250.0000 mL | Freq: Once | INTRAVENOUS | Status: DC

## 2022-02-06 MED ORDER — ONDANSETRON HCL 4 MG PO TABS: 4.0000 mg | ORAL_TABLET | Freq: Three times a day (TID) | ORAL | 0 refills | Status: AC | PRN

## 2022-02-06 MED ORDER — METHOCARBAMOL 500 MG PO TABS: 500.0000 mg | ORAL_TABLET | Freq: Three times a day (TID) | ORAL | 1 refills | Status: AC | PRN

## 2022-02-06 MED ORDER — VANCOMYCIN HCL 1000 MG IV SOLR
INTRAVENOUS | Status: AC
  Filled 2022-02-06: qty 20

## 2022-02-06 MED ORDER — TRANEXAMIC ACID-NACL 1000-0.7 MG/100ML-% IV SOLN
1000.0000 mg | INTRAVENOUS | Status: AC
  Administered 2022-02-06: 1000 mg via INTRAVENOUS
  Filled 2022-02-06: qty 100

## 2022-02-06 MED ORDER — CEFAZOLIN SODIUM-DEXTROSE 2-4 GM/100ML-% IV SOLN
2.0000 g | INTRAVENOUS | Status: AC
  Administered 2022-02-06: 2 g via INTRAVENOUS
  Filled 2022-02-06: qty 100

## 2022-02-06 MED ORDER — LIDOCAINE 2% (20 MG/ML) 5 ML SYRINGE
INTRAMUSCULAR | Status: DC | PRN
  Administered 2022-02-06: 100 mg via INTRAVENOUS

## 2022-02-06 MED ORDER — OXYCODONE-ACETAMINOPHEN 5-325 MG PO TABS: 1.0000 | ORAL_TABLET | ORAL | 0 refills | Status: AC | PRN

## 2022-02-06 MED ORDER — BUPIVACAINE-EPINEPHRINE (PF) 0.5% -1:200000 IJ SOLN
INTRAMUSCULAR | Status: DC | PRN
  Administered 2022-02-06: 15 mL via PERINEURAL

## 2022-02-06 MED ORDER — BUPIVACAINE LIPOSOME 1.3 % IJ SUSP
INTRAMUSCULAR | Status: DC | PRN
  Administered 2022-02-06: 10 mL via PERINEURAL

## 2022-02-06 MED ORDER — LACTATED RINGERS IV BOLUS: 500.0000 mL | Freq: Once | INTRAVENOUS | Status: DC

## 2022-02-06 MED ORDER — ONDANSETRON HCL 4 MG/2ML IJ SOLN: 4.0000 mg | Freq: Four times a day (QID) | INTRAMUSCULAR | Status: DC | PRN

## 2022-02-06 MED ORDER — FENTANYL CITRATE PF 50 MCG/ML IJ SOSY
50.0000 ug | PREFILLED_SYRINGE | INTRAMUSCULAR | Status: DC
  Administered 2022-02-06: 50 ug via INTRAVENOUS
  Filled 2022-02-06: qty 2

## 2022-02-06 MED ORDER — PHENYLEPHRINE HCL-NACL 20-0.9 MG/250ML-% IV SOLN
INTRAVENOUS | Status: DC | PRN
  Administered 2022-02-06: 40 ug/min via INTRAVENOUS

## 2022-02-06 MED ORDER — MIDAZOLAM HCL 2 MG/2ML IJ SOLN
1.0000 mg | INTRAMUSCULAR | Status: DC
  Filled 2022-02-06: qty 2

## 2022-02-06 MED ORDER — ORAL CARE MOUTH RINSE: 15.0000 mL | Freq: Once | OROMUCOSAL | Status: AC

## 2022-02-06 MED ORDER — AMISULPRIDE (ANTIEMETIC) 5 MG/2ML IV SOLN: 10.0000 mg | Freq: Once | INTRAVENOUS | Status: DC | PRN

## 2022-02-06 MED ORDER — ROCURONIUM BROMIDE 10 MG/ML (PF) SYRINGE
PREFILLED_SYRINGE | INTRAVENOUS | Status: DC | PRN
  Administered 2022-02-06: 50 mg via INTRAVENOUS

## 2022-02-06 MED ORDER — DEXAMETHASONE SODIUM PHOSPHATE 10 MG/ML IJ SOLN
INTRAMUSCULAR | Status: DC | PRN
  Administered 2022-02-06: 4 mg via INTRAVENOUS

## 2022-02-06 MED ORDER — FENTANYL CITRATE PF 50 MCG/ML IJ SOSY: 25.0000 ug | PREFILLED_SYRINGE | INTRAMUSCULAR | Status: DC | PRN

## 2022-02-06 MED ORDER — STERILE WATER FOR IRRIGATION IR SOLN
Status: DC | PRN
  Administered 2022-02-06: 2000 mL

## 2022-02-06 MED ORDER — 0.9 % SODIUM CHLORIDE (POUR BTL) OPTIME
TOPICAL | Status: DC | PRN
  Administered 2022-02-06: 1000 mL

## 2022-02-06 MED ORDER — VANCOMYCIN HCL 1000 MG IV SOLR
INTRAVENOUS | Status: DC | PRN
  Administered 2022-02-06: 1000 mg via TOPICAL

## 2022-02-06 MED ORDER — PROPOFOL 10 MG/ML IV BOLUS
INTRAVENOUS | Status: DC | PRN
  Administered 2022-02-06: 120 mg via INTRAVENOUS
  Administered 2022-02-06: 30 mg via INTRAVENOUS

## 2022-02-06 MED ORDER — LACTATED RINGERS IV SOLN: INTRAVENOUS | Status: DC

## 2022-02-06 MED ORDER — ACETAMINOPHEN 500 MG PO TABS
1000.0000 mg | ORAL_TABLET | Freq: Once | ORAL | Status: AC
  Administered 2022-02-06: 1000 mg via ORAL
  Filled 2022-02-06: qty 2

## 2022-02-06 MED ORDER — ONDANSETRON HCL 4 MG/2ML IJ SOLN
INTRAMUSCULAR | Status: DC | PRN
  Administered 2022-02-06: 4 mg via INTRAVENOUS

## 2022-02-06 MED ORDER — EPHEDRINE SULFATE-NACL 50-0.9 MG/10ML-% IV SOSY
PREFILLED_SYRINGE | INTRAVENOUS | Status: DC | PRN
  Administered 2022-02-06 (×3): 5 mg via INTRAVENOUS

## 2022-02-06 MED ORDER — CHLORHEXIDINE GLUCONATE 0.12 % MT SOLN
15.0000 mL | Freq: Once | OROMUCOSAL | Status: AC
  Administered 2022-02-06: 15 mL via OROMUCOSAL

## 2022-02-06 MED ORDER — METOCLOPRAMIDE HCL 5 MG/ML IJ SOLN: 5.0000 mg | Freq: Three times a day (TID) | INTRAMUSCULAR | Status: DC | PRN

## 2022-02-06 SURGICAL SUPPLY — 72 items
ADH SKN CLS APL DERMABOND .7 (GAUZE/BANDAGES/DRESSINGS) ×1
AID PSTN UNV HD RSTRNT DISP (MISCELLANEOUS) ×1
BAG COUNTER SPONGE SURGICOUNT (BAG) IMPLANT
BAG SPEC THK2 15X12 ZIP CLS (MISCELLANEOUS) ×1
BAG SPNG CNTER NS LX DISP (BAG)
BAG ZIPLOCK 12X15 (MISCELLANEOUS) ×1 IMPLANT
BIT DRILL AR 3 (BIT) ×1
BIT DRILL AR 3 NS (BIT) IMPLANT
BLADE SAW SGTL 83.5X18.5 (BLADE) ×1 IMPLANT
BNDG CMPR 5X4 CHSV STRCH STRL (GAUZE/BANDAGES/DRESSINGS) ×1
BNDG COHESIVE 4X5 TAN STRL LF (GAUZE/BANDAGES/DRESSINGS) ×1 IMPLANT
BSPLAT GLND +2X24 MDLR (Joint) ×1 IMPLANT
COOLER ICEMAN CLASSIC (MISCELLANEOUS) ×1 IMPLANT
COVER BACK TABLE 60X90IN (DRAPES) ×1 IMPLANT
COVER SURGICAL LIGHT HANDLE (MISCELLANEOUS) ×1 IMPLANT
CUP SUT UNIV REVERS 39 NEU (Shoulder) IMPLANT
DERMABOND ADVANCED .7 DNX12 (GAUZE/BANDAGES/DRESSINGS) ×1 IMPLANT
DRAPE ORTHO SPLIT 77X108 STRL (DRAPES) ×2
DRAPE SHEET LG 3/4 BI-LAMINATE (DRAPES) ×1 IMPLANT
DRAPE SURG 17X11 SM STRL (DRAPES) ×1 IMPLANT
DRAPE SURG ORHT 6 SPLT 77X108 (DRAPES) ×2 IMPLANT
DRAPE TOP 10253 STERILE (DRAPES) ×1 IMPLANT
DRAPE U-SHAPE 47X51 STRL (DRAPES) ×1 IMPLANT
DRESSING AQUACEL AG SP 3.5X6 (GAUZE/BANDAGES/DRESSINGS) ×1 IMPLANT
DRILL SURG AR 10 (DRILL) IMPLANT
DRSG AQUACEL AG ADV 3.5X 6 (GAUZE/BANDAGES/DRESSINGS) IMPLANT
DRSG AQUACEL AG ADV 3.5X10 (GAUZE/BANDAGES/DRESSINGS) IMPLANT
DRSG AQUACEL AG SP 3.5X6 (GAUZE/BANDAGES/DRESSINGS) ×1
DURAPREP 26ML APPLICATOR (WOUND CARE) ×1 IMPLANT
ELECT BLADE TIP CTD 4 INCH (ELECTRODE) ×1 IMPLANT
ELECT PENCIL ROCKER SW 15FT (MISCELLANEOUS) ×1 IMPLANT
ELECT REM PT RETURN 15FT ADLT (MISCELLANEOUS) ×1 IMPLANT
FACESHIELD WRAPAROUND (MASK) ×5 IMPLANT
FACESHIELD WRAPAROUND OR TEAM (MASK) ×5 IMPLANT
GLENOID UNI REV MOD 24 +2 LAT (Joint) IMPLANT
GLENOSPHERE 39+4 LAT/24 UNI RV (Joint) IMPLANT
GLOVE BIO SURGEON STRL SZ7.5 (GLOVE) ×1 IMPLANT
GLOVE BIO SURGEON STRL SZ8 (GLOVE) ×1 IMPLANT
GLOVE SS BIOGEL STRL SZ 7 (GLOVE) ×1 IMPLANT
GLOVE SS BIOGEL STRL SZ 7.5 (GLOVE) ×1 IMPLANT
GOWN STRL SURGICAL XL XLNG (GOWN DISPOSABLE) ×2 IMPLANT
INSERT HUMERAL M/39 +3/CNSTRND (Miscellaneous) IMPLANT
KIT BASIN OR (CUSTOM PROCEDURE TRAY) ×1 IMPLANT
KIT TURNOVER KIT A (KITS) IMPLANT
MANIFOLD NEPTUNE II (INSTRUMENTS) ×1 IMPLANT
NDL TAPERED W/ NITINOL LOOP (MISCELLANEOUS) ×1 IMPLANT
NEEDLE TAPERED W/ NITINOL LOOP (MISCELLANEOUS) ×1 IMPLANT
NS IRRIG 1000ML POUR BTL (IV SOLUTION) ×1 IMPLANT
PACK SHOULDER (CUSTOM PROCEDURE TRAY) ×1 IMPLANT
PAD ARMBOARD 7.5X6 YLW CONV (MISCELLANEOUS) ×1 IMPLANT
PAD COLD SHLDR WRAP-ON (PAD) ×1 IMPLANT
PIN NITINOL TARGETER 2.8 (PIN) IMPLANT
PIN SET MODULAR GLENOID SYSTEM (PIN) IMPLANT
RESTRAINT HEAD UNIVERSAL NS (MISCELLANEOUS) ×1 IMPLANT
SCREW CENTRAL MOD 30MM (Screw) IMPLANT
SCREW PERI LOCK 5.5X24 (Screw) IMPLANT
SCREW PERI LOCK 5.5X32 (Screw) IMPLANT
SCREW PERIPHERAL 5.5X20 LOCK (Screw) IMPLANT
SLING ARM FOAM STRAP LRG (SOFTGOODS) IMPLANT
SLING ARM FOAM STRAP MED (SOFTGOODS) IMPLANT
SPACER SHLD UNI REV 39 +6 (Shoulder) IMPLANT
STEM HUMERAL UNI REVERSE SZ10 (Stem) IMPLANT
SUT MNCRL AB 3-0 PS2 18 (SUTURE) ×1 IMPLANT
SUT MON AB 2-0 CT1 36 (SUTURE) ×1 IMPLANT
SUT VIC AB 1 CT1 36 (SUTURE) ×1 IMPLANT
SUTURE TAPE 1.3 40 TPR END (SUTURE) ×2 IMPLANT
SUTURETAPE 1.3 40 TPR END (SUTURE) ×2
TOWEL OR 17X26 10 PK STRL BLUE (TOWEL DISPOSABLE) ×1 IMPLANT
TOWEL OR NON WOVEN STRL DISP B (DISPOSABLE) ×1 IMPLANT
TUBE SUCTION HIGH CAP CLEAR NV (SUCTIONS) ×1 IMPLANT
TUBING CONNECTING 10 (TUBING) IMPLANT
WATER STERILE IRR 1000ML POUR (IV SOLUTION) ×2 IMPLANT

## 2022-02-06 NOTE — Anesthesia Preprocedure Evaluation (Addendum)
Anesthesia Evaluation  Patient identified by MRN, date of birth, ID band Patient awake    Reviewed: Allergy & Precautions, NPO status , Patient's Chart, lab work & pertinent test results  Airway Mallampati: II  TM Distance: >3 FB Neck ROM: Full    Dental  (+) Dental Advisory Given   Pulmonary former smoker   breath sounds clear to auscultation       Cardiovascular hypertension, Pt. on medications and Pt. on home beta blockers + CAD, + Past MI, + CABG and + Peripheral Vascular Disease   Rhythm:Regular Rate:Normal     Neuro/Psych negative neurological ROS     GI/Hepatic Neg liver ROS,GERD  ,,  Endo/Other  diabetes, Type 2, Oral Hypoglycemic Agents    Renal/GU negative Renal ROS     Musculoskeletal   Abdominal   Peds  Hematology negative hematology ROS (+)   Anesthesia Other Findings   Reproductive/Obstetrics                             Anesthesia Physical Anesthesia Plan  ASA: 3  Anesthesia Plan: General   Post-op Pain Management: Regional block* and Tylenol PO (pre-op)*   Induction: Intravenous  PONV Risk Score and Plan: 2 and Dexamethasone, Ondansetron and Treatment may vary due to age or medical condition  Airway Management Planned: Oral ETT  Additional Equipment:   Intra-op Plan:   Post-operative Plan: Extubation in OR  Informed Consent: I have reviewed the patients History and Physical, chart, labs and discussed the procedure including the risks, benefits and alternatives for the proposed anesthesia with the patient or authorized representative who has indicated his/her understanding and acceptance.     Dental advisory given  Plan Discussed with: CRNA  Anesthesia Plan Comments:        Anesthesia Quick Evaluation

## 2022-02-06 NOTE — Discharge Instructions (Addendum)
   Jeffrey Kelly. Supple, M.D., F.A.A.O.S. Orthopaedic Surgery Specializing in Arthroscopic and Reconstructive Surgery of the Shoulder [phone] [address] [place], [place] [address] - Fax [phone]  POST-OP SHOULDER ARTHROSCOPIC ROTATOR CUFF AND/OR LABRAL REPAIR INSTRUCTIONS  1. Call the office at [phone] to schedule your first post-op appointment 7-10 days from the date of your surgery.  2. Leave the steri-strips in place over your incisions when performing dressing changes and showering. You may remove your dressings and begin showering 72 hours from surgery. You can expect drainage that is clear to bloody in nature that occasionally will soak through your dressings. If this occurs go ahead and perform a dressing change. The drainage should lessen daily and when there is no drainage from your incisions feel free to go without a dressing.  3. Wear your sling/immobilizer at all times except to perform the exercises below or to occasionally let your arm dangle by your side to stretch your elbow. You also need to sleep in your sling immobilizer until instructed otherwise.  4. Range of motion to your elbow, wrist, and hand are encouraged 3-5 times daily. Exercise to your hand and fingers helps to reduce swelling you may experience.  5. Utilize ice to the shoulder 3-4 times minimum a day and additionally if you are experiencing pain.  6. You may one-armed drive when safely off of narcotics and muscle relaxants. You may use your hand that is in the sling to support the steering wheel only. However, should it be your right arm that is in the sling it is not to be used for gear shifting in a manual transmission.  7. Pain control following an exparel block  To help control your post-operative pain you received a nerve block  performed with Exparel which is a long acting anesthetic (numbing agent) which can provide pain relief and sensations of numbness (and relief of pain) in the  operative shoulder and arm for up to 3 days. Sometimes it provides mixed relief, meaning you may still have numbness in certain areas of the arm but can still be able to move  parts of that arm, hand, and fingers. We recommend that your prescribed pain medications  be used as needed. We do not feel it is necessary to "pre medicate" and "stay ahead" of pain.  Taking narcotic pain medications when you are not having any pain can lead to unnecessary and potentially dangerous side effects.    8. Pain medications can produce constipation along with their use. If you experience this, the use of an over the counter stool softener or laxative daily is recommended.   9. For additional questions or concerns, please do not hesitate to call the office. If after hours there is an answering service to forward your concerns to the physician on call.  POST-OP EXERCISES  Pendulum Exercises  Perform pendulum exercises while standing and bending at the waist. Support your uninvolved arm on a table or chair and allow your operated arm to hang freely. Make sure to do these exercises passively - not using you shoulder muscle.  Repeat 20 times. Do 3 sessions per day.

## 2022-02-06 NOTE — Anesthesia Procedure Notes (Signed)
Procedure Name: Intubation Date/Time: 02/06/2022 12:17 PM  Performed by: Eben Burow, CRNAPre-anesthesia Checklist: Patient identified, Emergency Drugs available, Suction available, Patient being monitored and Timeout performed Patient Re-evaluated:Patient Re-evaluated prior to induction Oxygen Delivery Method: Circle system utilized Preoxygenation: Pre-oxygenation with 100% oxygen Induction Type: IV induction Ventilation: Mask ventilation without difficulty Laryngoscope Size: Glidescope and 3 Grade View: Grade II Tube type: Oral Tube size: 7.5 mm Number of attempts: 2 Airway Equipment and Method: Stylet Placement Confirmation: ETT inserted through vocal cords under direct vision, positive ETCO2 and breath sounds checked- equal and bilateral Secured at: 22 cm Tube secured with: Tape Dental Injury: Teeth and Oropharynx as per pre-operative assessment  Comments: Ovid Curd Low, SRNA DVL x 1 with view of epiglottis only, Glidescope x1 with grade 2 view, atraumatic intubation, +/= BBS, +EtCO2.

## 2022-02-06 NOTE — Transfer of Care (Signed)
Immediate Anesthesia Transfer of Care Note  Patient: Jeffrey Kelly  Procedure(s) Performed: REVERSE SHOULDER ARTHROPLASTY (Right: Shoulder)  Patient Location: PACU  Anesthesia Type:General and GA combined with regional for post-op pain  Level of Consciousness: awake, drowsy, and patient cooperative  Airway & Oxygen Therapy: Patient Spontanous Breathing and Patient connected to face mask oxygen  Post-op Assessment: Report given to RN and Post -op Vital signs reviewed and stable  Post vital signs: Reviewed and stable  Last Vitals:  Vitals Value Taken Time  BP 147/100   Temp    Pulse 51 02/06/22 1344  Resp 20 02/06/22 1344  SpO2 100 % 02/06/22 1344  Vitals shown include unvalidated device data.  Last Pain:  Vitals:   02/06/22 1155  TempSrc:   PainSc: 0-No pain         Complications: No notable events documented.

## 2022-02-06 NOTE — H&P (Signed)
Jeffrey Kelly    Chief Complaint: Right shoulder rotator cuff tear arthropathy HPI: The patient is a 88 y.o. male with chronic and progressive increasing right shoulder pain related to severe rotator cuff tear arthropathy.  Due to his increasing functional limitations and failure to respond to prolonged attempts at conservative management, he is brought to the operating room at this time for planned right shoulder reverse arthroplasty  Past Medical History:  Diagnosis Date   Arthritis    Cancer ([place])    skin   Coronary artery disease    Diabetes mellitus without complication ([place])    Dysrhythmia    GERD (gastroesophageal reflux disease)    History of kidney stones    Hypertension    Myocardial infarction ([place]) 2004   Pneumonia    Shortness of breath dyspnea    with exertion    Urinary tract infection    hx of hopsitalization 10/2013       Past Surgical History:  Procedure Laterality Date   [place] SURGERY  2002   2008 and 2002    CHOLECYSTECTOMY  2004   COLONOSCOPY  2012    [place] GRAFT  2004   excision of scrotal lipoma      EYE SURGERY Bilateral    bilateral extraction with iol   HERNIA REPAIR     02/01/2014     left ear surgery   1980   x 2    left hydrocelectomy   01/2014    left knee scope   2007    left spermatocelectomy   01/2014    LITHOTRIPSY  08/21/2014    right knee torn cartilage surgery   1970   scrotal cyst removed   1999   TOTAL KNEE ARTHROPLASTY Left 10/17/2014   Procedure: LEFT TOTAL KNEE ARTHROPLASTY;  Surgeon: Paralee Cancel, MD;  [facility];  Service: Orthopedics;  Laterality: Left;   TOTAL KNEE ARTHROPLASTY Right 11/27/2015   Procedure: RIGHT TOTAL KNEE ARTHROPLASTY;  Surgeon: Paralee Cancel, MD;  Location[facility];  Service: Orthopedics;  Laterality: Right;   trigger finer release   2011    [place]    History reviewed. No  pertinent family history.  Social History:  reports that he has quit smoking. He has never used smokeless tobacco. He reports that he does not drink alcohol and does not use drugs.  BMI: Estimated body mass index is 28.76 kg/m as calculated from the following:   Height as of this encounter: '5\' 4"'$  (1.626 m).   Weight as of this encounter: 76 kg.  Lab Results  Component Value Date   ALBUMIN 4.3 11/30/2015   Diabetes:   Patient has a diagnosis of diabetes,  Lab Results  Component Value Date   HGBA1C 7.5 (H) 11/20/2015   Smoking Status:       Medications Prior to Admission  Medication Sig Dispense Refill   aspirin EC 81 MG tablet Take 81 mg by mouth daily. Swallow whole.     atorvastatin (LIPITOR) 40 MG tablet Take 40 mg by mouth at bedtime.     B Complex-C (SUPER B COMPLEX PO) Take 1 tablet by mouth every morning.      cetirizine (ZYRTEC) 10 MG tablet Take 10 mg by mouth every morning.      dapagliflozin propanediol (FARXIGA) 10 MG TABS tablet Take 10 mg by mouth  daily.     diclofenac Sodium (VOLTAREN) 1 % GEL Apply 1 Application topically 2 (two) times daily as needed (shoulder pain).     Glycerin-Hypromellose-PEG 400 (DRY EYE RELIEF DROPS) 0.2-0.2-1 % SOLN Place 1 drop into both eyes every morning.     insulin glargine-yfgn (SEMGLEE, YFGN,) 100 UNIT/ML injection Inject 65 Units into the skin every morning.     lisinopril (ZESTRIL) 5 MG tablet Take 5 mg by mouth every morning.     methocarbamol (ROBAXIN) 500 MG tablet Take 1 tablet (500 mg total) by mouth every 6 (six) hours as needed for muscle spasms. (Patient taking differently: Take 250 mg by mouth in the morning and at bedtime.) 40 tablet 0   metoprolol tartrate (LOPRESSOR) 25 MG tablet Take 25 mg by mouth 2 (two) times daily.     polyethylene glycol (MIRALAX / GLYCOLAX) packet Take 17 g by mouth 2 (two) times daily. (Patient taking differently: Take 17 g by mouth daily as needed for moderate constipation.) 14 each 0    Semaglutide, 1 MG/DOSE, (OZEMPIC, 1 MG/DOSE,) 2 MG/1.5ML SOPN Inject 1 mg into the skin every Sunday.     tamsulosin (FLOMAX) 0.4 MG CAPS capsule Take 0.4 mg by mouth daily after breakfast.      acetaminophen (TYLENOL) 650 MG CR tablet Take 1,300 mg by mouth every 8 (eight) hours as needed for pain.       Physical Exam: Right shoulder demonstrates painful and guarded motion as noted at his recent office visits.  He has globally decreased strength to manual muscle testing.  Otherwise remains neurovascularly intact in the right upper extremity.  Radiographs  Plain films of the right shoulder demonstrate advanced glenohumeral arthritis with narrowing of the acromiohumeral interval.  Recent MRI scan shows a large and severely retracted chronic tear of the rotator cuff superiorly.  Vitals  Temp:  [97.4 F (36.3 C)] 97.4 F (36.3 C) (01/11 1039) Pulse Rate:  [53] 53 (01/11 1039) Resp:  [13] 13 (01/11 1039) BP: (147)/(61) 147/61 (01/11 1039) SpO2:  [98 %] 98 % (01/11 1039) Weight:  [76 kg] 76 kg (01/11 1107)  Assessment/Plan  Impression: Right shoulder rotator cuff tear arthropathy  Plan of Action: Procedure(s): REVERSE SHOULDER ARTHROPLASTY  Jeffrey Kelly M Jeffrey Kelly 02/06/2022, 11:33 AM Contact # [phone]

## 2022-02-06 NOTE — Op Note (Signed)
02/06/2022  1:30 PM  PATIENT:   Jeffrey Kelly  88 y.o. male  PRE-OPERATIVE DIAGNOSIS:  Right shoulder rotator cuff tear arthropathy  POST-OPERATIVE DIAGNOSIS: Same  PROCEDURE: Right shoulder reverse arthroplasty utilizing a press-fit size 11 Arthrex stem with a neutral metaphysis, +6 spacer, +3 constrained polyethylene insert, 39/+4 glenosphere and a small/+2 baseplate  SURGEON:  Kashena Novitski, Metta Clines M.D.  ASSISTANTS: Jenetta Loges, PA-C  ANESTHESIA:   General endotracheal and interscalene block with Exparel  EBL: 100 cc  SPECIMEN: None  Drains: None   PATIENT DISPOSITION:  PACU - hemodynamically stable.    PLAN OF CARE: Discharge to home after PACU  Brief history:  Patient is an 88 year old male well-known to our practice having been followed for chronic and progressively increasing right shoulder pain related to severe rotator cuff tear arthropathy.  Due to his increasing functional limitations and failure to respond to prolonged attempts at conservative management, he is brought to the operating this time for planned right shoulder reverse arthroplasty.  Preoperatively, I counseled the patient regarding treatment options and risks versus benefits thereof.  Possible surgical complications were all reviewed including potential for bleeding, infection, neurovascular injury, persistent pain, loss of motion, anesthetic complication, failure of the implant, and possible need for additional surgery. They understand and accept and agrees with our planned procedure.   Procedure detail:  After undergoing routine preop evaluation the patient received prophylactic antibiotics and interscalene block with Exparel was established in the holding area by the anesthesia department.  Subsequently placed supine on the operating table and underwent the smooth induction of a general endotracheal anesthesia.  Placed into the beachchair position and appropriately padded and protected.  The right  shoulder girdle region was sterilely prepped and draped in standard fashion.  Timeout was called.  A deltopectoral approach was made to the right shoulder through an approximately 8 cm incision.  Skin flaps elevated dissection carried deep and the deltopectoral interval was then developed from proximal to distal with the vein taken laterally.  The conjoined tendon was mobilized and retracted medially and adhesions were divided beneath the deltoid.  There had been previous rupture of the long head biceps tendon and the entire superior aspect of the humeral head was denuded of all rotator cuff attachments and there was just a very small portion of the inferior subscapularis that remained attached which appeared to be vestigial and nonviable.  We did separate the subscap and tagged it for retraction but it did not show significant elasticity.  The humeral head was then delivered through the wound and extra medullary guide was then used to outline the proposed humeral head resection which we performed with an oscillating saw at approximately 20 degrees of retroversion.  Marginal osteophytes were then removed with a rondure.  A metal cap placed over the cut proximal humeral surface and we then exposed the glenoid with appropriate retractors and performed a circumferential labral resection.  A guidepin was then directed into the center of the glenoid and the glenoid was then reamed with a central followed by the peripheral reamer to a stable subchondral bony bed in preparation completed with the central drill and tapped for a 30 mm lag screw.  Our baseplate was then assembled and inserted with vancomycin powder applied to the threads of the lag screw and excellent purchase and fixation was achieved.  The peripheral locking screws were all then placed using standard technique with excellent fixation.  A 39/+4 glenosphere was then impacted onto the baseplate and  the central locking screw was placed.  Attention returned to  the humeral metaphysis where the canal was opened by hand reaming and ultimately broached up to a size 11.  A neutral metaphyseal reaming guide was then used to prepare the metaphysis.  A trial implant was then placed and a trial reduction showed excellent motion stability and soft tissue balance all much to our satisfaction.  At this point the trial implant was removed.  The final implant was assembled and then impacted after vancomycin powder was sprayed into the humeral canal.  Excellent fixation was achieved.  Prior directions were then performed and we ultimately felt that a +6 spacer with a +3 poly gave [place] the best soft tissue balance motion and stability.  We did utilize a constrained +3 poly to give additional stability.  The final extension and poly was then implanted and final reduction showed excellent motion stability and soft tissue balance all much to our satisfaction.  The wound was then copiously irrigated.  Final hemostasis was obtained.  The balance of the vancomycin powder was then sprayed liberally throughout the deep soft tissue planes.  #1 Vicryl was used to close the deltopectoral interval, 2-0 Monocryl used to close the subcu layer and intracuticular 3-0 Monocryl used to close the skin followed by Dermabond and Aquacel dressing.  The right arm was then placed into a sling and the patient was awakened, extubated, and taken to the recovery room in stable condition.  Marin Shutter MD   Contact # [phone]

## 2022-02-06 NOTE — Evaluation (Signed)
Occupational Therapy Evaluation Patient Details Name: Jeffrey Kelly MRN: [id number] DOB: March 16, 1934 Today's Date: 02/06/2022   History of Present Illness Patient s/p R reverse shoulder arthroplasty   Clinical Impression   Jeffrey Kelly is an 88 year old man s/p shoulder replacement without functional use of right non-dominant upper extremity secondary to effects of surgery and interscalene block and shoulder precautions. Therapist provided education and instruction to patient and spouse in regards to exercises, precautions, positioning, donning upper extremity clothing and bathing while maintaining shoulder precautions, ice and edema management and donning/doffing sling. Patient and spouse verbalized understanding and demonstrated as needed. Patient needed assistance to donn shirt, underwear, pants, socks and shoes and provided with instruction on compensatory strategies to perform ADLs. Patient to follow up with MD for further therapy needs.        Recommendations for follow up therapy are one component of a multi-disciplinary discharge planning process, led by the attending physician.  Recommendations may be updated based on patient status, additional functional criteria and insurance authorization.   Follow Up Recommendations  Follow physician's recommendations for discharge plan and follow up therapies     Assistance Recommended at Discharge    Patient can return home with the following A little help with bathing/dressing/bathroom;Assistance with cooking/housework    Functional Status Assessment  Patient has had a recent decline in their functional status and demonstrates the ability to make significant improvements in function in a reasonable and predictable amount of time.  Equipment Recommendations  None recommended by OT    Recommendations for Other Services       Precautions / Restrictions Precautions Precautions: Shoulder Type of Shoulder Precautions: If sitting in  controlled environment, ok to come out of sling to give neck a break. Please sleep in it to protect until follow up in office.    OK to use operative arm for feeding, hygiene and ADLs.  Ok to instruct Pendulums and lap slides as exercises. Ok to use operative arm within the following parameters for ADL purposes    New ROM (8/18)  Ok for PROM, AAROM, AROM within pain tolerance and within the following ROM  ER 20  ABD 45  FE 60 Shoulder Interventions: Shoulder sling/immobilizer;Off for dressing/bathing/exercises Precaution Booklet Issued: Yes (comment) Required Braces or Orthoses: Sling Restrictions Weight Bearing Restrictions: Yes RUE Weight Bearing: Non weight bearing      Mobility Bed Mobility                    Transfers                          Balance Overall balance assessment: No apparent balance deficits (not formally assessed)                                         ADL either performed or assessed with clinical judgement   ADL                                               Vision Baseline Vision/History: 1 Wears glasses Patient Visual Report: No change from baseline       Perception     Praxis      Pertinent Vitals/Pain Pain Assessment Pain  Assessment: No/denies pain     Hand Dominance     Extremity/Trunk Assessment Upper Extremity Assessment Upper Extremity Assessment: RUE deficits/detail RUE Deficits / Details: impaired motor control and sensation secondary to block   Lower Extremity Assessment Lower Extremity Assessment: Overall WFL for tasks assessed   Cervical / Trunk Assessment Cervical / Trunk Assessment: Normal   Communication     Cognition Arousal/Alertness: Awake/alert Behavior During Therapy: WFL for tasks assessed/performed Overall Cognitive Status: Within Functional Limits for tasks assessed                                       General Comments       Exercises      Shoulder Instructions Shoulder Instructions Donning/doffing shirt without moving shoulder: Caregiver independent with task Method for sponge bathing under operated UE: Caregiver independent with task Donning/doffing sling/immobilizer: Caregiver independent with task Correct positioning of sling/immobilizer: Caregiver independent with task ROM for elbow, wrist and digits of operated UE: Caregiver independent with task Sling wearing schedule (on at all times/off for ADL's): Caregiver independent with task Proper positioning of operated UE when showering: Caregiver independent with task Dressing change: Independent Positioning of UE while sleeping: Bishop expects to be discharged to:: Private residence Living Arrangements: Alone Available Help at Discharge: Family                                    Prior Functioning/Environment                          OT Problem List: Decreased strength;Decreased range of motion;Impaired UE functional use;Pain      OT Treatment/Interventions:      OT Goals(Current goals can be found in the care plan section) Acute Rehab OT Goals OT Goal Formulation: All assessment and education complete, DC therapy  OT Frequency:      Co-evaluation              AM-PAC OT "6 Clicks" Daily Activity     Outcome Measure Help from another person eating meals?: A Little Help from another person taking care of personal grooming?: A Little Help from another person toileting, which includes using toliet, bedpan, or urinal?: A Little Help from another person bathing (including washing, rinsing, drying)?: A Little Help from another person to put on and taking off regular upper body clothing?: A Lot Help from another person to put on and taking off regular lower body clothing?: A Lot 6 Click Score: 16   End of Session Nurse Communication:  (OT education complete)  Activity Tolerance: Patient tolerated  treatment well Patient left: in chair;with family/visitor present  OT Visit Diagnosis: Pain                Time: 1535-1600 OT Time Calculation (min): 25 min Charges:  OT General Charges $OT Visit: 1 Visit OT Evaluation $OT Eval Low Complexity: 1 Low OT Treatments $Self Care/Home Management : 8-22 mins  Gustavo Lah, OTR/L Acute Care Rehab Services  Office [phone]   Lenward Chancellor 02/06/2022, 4:07 PM

## 2022-02-06 NOTE — Anesthesia Procedure Notes (Addendum)
Anesthesia Regional Block: Interscalene brachial plexus block   Pre-Anesthetic Checklist: , timeout performed,  Correct Patient, Correct Site, Correct Laterality,  Correct Procedure, Correct Position, site marked,  Risks and benefits discussed,  Surgical consent,  Pre-op evaluation,  At surgeon's request and post-op pain management  Laterality: Right  Prep: chloraprep       Needles:  Injection technique: Single-shot  Needle Type: Echogenic Stimulator Needle     Needle Length: 9cm  Needle Gauge: 21     Additional Needles:   Procedures:, nerve stimulator,,, ultrasound used (permanent image in chart),,     Nerve Stimulator or Paresthesia:  Response: deltoid, 0.6 mA  Additional Responses:   Narrative:  Start time: 02/06/2022 11:37 AM End time: 02/06/2022 11:43 AM Injection made incrementally with aspirations every 5 mL.  Performed by: Personally  Anesthesiologist: Suzette Battiest, MD

## 2022-02-06 NOTE — Anesthesia Postprocedure Evaluation (Signed)
Anesthesia Post Note  Patient: Jeffrey Kelly  Procedure(s) Performed: REVERSE SHOULDER ARTHROPLASTY (Right: Shoulder)     Patient location during evaluation: PACU Anesthesia Type: General Level of consciousness: awake and alert Pain management: pain level controlled Vital Signs Assessment: post-procedure vital signs reviewed and stable Respiratory status: spontaneous breathing, nonlabored ventilation, respiratory function stable and patient connected to nasal cannula oxygen Cardiovascular status: blood pressure returned to baseline and stable Postop Assessment: no apparent nausea or vomiting Anesthetic complications: no  No notable events documented.  Last Vitals:  Vitals:   02/06/22 1400 02/06/22 1415  BP: 125/69 138/61  Pulse: (!) 51 (!) 50  Resp: (!) 22 19  Temp:  (!) 35.6 C  SpO2: 97% 98%    Last Pain:  Vitals:   02/06/22 1415  TempSrc:   PainSc: 0-No pain                 [name]

## 2022-02-07 ENCOUNTER — Encounter (HOSPITAL_COMMUNITY): Payer: Self-pay | Admitting: Orthopedic Surgery

## 2022-02-17 DIAGNOSIS — Z5189 Encounter for other specified aftercare: Secondary | ICD-10-CM | POA: Diagnosis not present

## 2022-02-17 DIAGNOSIS — Z96611 Presence of right artificial shoulder joint: Secondary | ICD-10-CM | POA: Diagnosis not present

## 2022-02-27 DIAGNOSIS — I739 Peripheral vascular disease, unspecified: Secondary | ICD-10-CM | POA: Diagnosis not present

## 2022-02-27 DIAGNOSIS — E1122 Type 2 diabetes mellitus with diabetic chronic kidney disease: Secondary | ICD-10-CM | POA: Diagnosis not present

## 2022-03-10 DIAGNOSIS — M6281 Muscle weakness (generalized): Secondary | ICD-10-CM | POA: Diagnosis not present

## 2022-03-10 DIAGNOSIS — M25511 Pain in right shoulder: Secondary | ICD-10-CM | POA: Diagnosis not present

## 2022-03-13 DIAGNOSIS — M25511 Pain in right shoulder: Secondary | ICD-10-CM | POA: Diagnosis not present

## 2022-03-13 DIAGNOSIS — M6281 Muscle weakness (generalized): Secondary | ICD-10-CM | POA: Diagnosis not present

## 2022-03-17 DIAGNOSIS — N1832 Chronic kidney disease, stage 3b: Secondary | ICD-10-CM | POA: Diagnosis not present

## 2022-03-17 DIAGNOSIS — E1169 Type 2 diabetes mellitus with other specified complication: Secondary | ICD-10-CM | POA: Diagnosis not present

## 2022-03-20 DIAGNOSIS — N200 Calculus of kidney: Secondary | ICD-10-CM | POA: Diagnosis not present

## 2022-03-20 DIAGNOSIS — N401 Enlarged prostate with lower urinary tract symptoms: Secondary | ICD-10-CM | POA: Diagnosis not present

## 2022-03-20 DIAGNOSIS — N138 Other obstructive and reflux uropathy: Secondary | ICD-10-CM | POA: Diagnosis not present

## 2022-03-20 DIAGNOSIS — M6281 Muscle weakness (generalized): Secondary | ICD-10-CM | POA: Diagnosis not present

## 2022-03-20 DIAGNOSIS — M25511 Pain in right shoulder: Secondary | ICD-10-CM | POA: Diagnosis not present

## 2022-03-24 DIAGNOSIS — M6281 Muscle weakness (generalized): Secondary | ICD-10-CM | POA: Diagnosis not present

## 2022-03-24 DIAGNOSIS — M25511 Pain in right shoulder: Secondary | ICD-10-CM | POA: Diagnosis not present

## 2022-03-28 DIAGNOSIS — M25511 Pain in right shoulder: Secondary | ICD-10-CM | POA: Diagnosis not present

## 2022-03-28 DIAGNOSIS — E1122 Type 2 diabetes mellitus with diabetic chronic kidney disease: Secondary | ICD-10-CM | POA: Diagnosis not present

## 2022-03-28 DIAGNOSIS — I739 Peripheral vascular disease, unspecified: Secondary | ICD-10-CM | POA: Diagnosis not present

## 2022-03-28 DIAGNOSIS — M6281 Muscle weakness (generalized): Secondary | ICD-10-CM | POA: Diagnosis not present

## 2022-03-31 DIAGNOSIS — E782 Mixed hyperlipidemia: Secondary | ICD-10-CM | POA: Diagnosis not present

## 2022-03-31 DIAGNOSIS — M6281 Muscle weakness (generalized): Secondary | ICD-10-CM | POA: Diagnosis not present

## 2022-03-31 DIAGNOSIS — E1169 Type 2 diabetes mellitus with other specified complication: Secondary | ICD-10-CM | POA: Diagnosis not present

## 2022-03-31 DIAGNOSIS — E1122 Type 2 diabetes mellitus with diabetic chronic kidney disease: Secondary | ICD-10-CM | POA: Diagnosis not present

## 2022-03-31 DIAGNOSIS — M25511 Pain in right shoulder: Secondary | ICD-10-CM | POA: Diagnosis not present

## 2022-03-31 DIAGNOSIS — Z6830 Body mass index (BMI) 30.0-30.9, adult: Secondary | ICD-10-CM | POA: Diagnosis not present

## 2022-03-31 DIAGNOSIS — N183 Chronic kidney disease, stage 3 unspecified: Secondary | ICD-10-CM | POA: Diagnosis not present

## 2022-04-03 DIAGNOSIS — M25511 Pain in right shoulder: Secondary | ICD-10-CM | POA: Diagnosis not present

## 2022-04-03 DIAGNOSIS — M6281 Muscle weakness (generalized): Secondary | ICD-10-CM | POA: Diagnosis not present

## 2022-04-07 DIAGNOSIS — M6281 Muscle weakness (generalized): Secondary | ICD-10-CM | POA: Diagnosis not present

## 2022-04-07 DIAGNOSIS — M25511 Pain in right shoulder: Secondary | ICD-10-CM | POA: Diagnosis not present

## 2022-04-10 DIAGNOSIS — M25511 Pain in right shoulder: Secondary | ICD-10-CM | POA: Diagnosis not present

## 2022-04-10 DIAGNOSIS — M6281 Muscle weakness (generalized): Secondary | ICD-10-CM | POA: Diagnosis not present

## 2022-04-14 DIAGNOSIS — M25511 Pain in right shoulder: Secondary | ICD-10-CM | POA: Diagnosis not present

## 2022-04-14 DIAGNOSIS — M6281 Muscle weakness (generalized): Secondary | ICD-10-CM | POA: Diagnosis not present

## 2022-04-17 DIAGNOSIS — L578 Other skin changes due to chronic exposure to nonionizing radiation: Secondary | ICD-10-CM | POA: Diagnosis not present

## 2022-04-17 DIAGNOSIS — L821 Other seborrheic keratosis: Secondary | ICD-10-CM | POA: Diagnosis not present

## 2022-04-17 DIAGNOSIS — L570 Actinic keratosis: Secondary | ICD-10-CM | POA: Diagnosis not present

## 2022-04-17 DIAGNOSIS — L820 Inflamed seborrheic keratosis: Secondary | ICD-10-CM | POA: Diagnosis not present

## 2022-04-18 DIAGNOSIS — M6281 Muscle weakness (generalized): Secondary | ICD-10-CM | POA: Diagnosis not present

## 2022-04-18 DIAGNOSIS — M25511 Pain in right shoulder: Secondary | ICD-10-CM | POA: Diagnosis not present

## 2022-04-21 DIAGNOSIS — M25511 Pain in right shoulder: Secondary | ICD-10-CM | POA: Diagnosis not present

## 2022-04-21 DIAGNOSIS — M6281 Muscle weakness (generalized): Secondary | ICD-10-CM | POA: Diagnosis not present

## 2022-04-23 DIAGNOSIS — Z471 Aftercare following joint replacement surgery: Secondary | ICD-10-CM | POA: Diagnosis not present

## 2022-04-23 DIAGNOSIS — Z96611 Presence of right artificial shoulder joint: Secondary | ICD-10-CM | POA: Diagnosis not present

## 2022-04-24 DIAGNOSIS — M25511 Pain in right shoulder: Secondary | ICD-10-CM | POA: Diagnosis not present

## 2022-04-24 DIAGNOSIS — M6281 Muscle weakness (generalized): Secondary | ICD-10-CM | POA: Diagnosis not present

## 2022-04-28 DIAGNOSIS — E1122 Type 2 diabetes mellitus with diabetic chronic kidney disease: Secondary | ICD-10-CM | POA: Diagnosis not present

## 2022-04-28 DIAGNOSIS — I739 Peripheral vascular disease, unspecified: Secondary | ICD-10-CM | POA: Diagnosis not present

## 2022-04-28 DIAGNOSIS — M25511 Pain in right shoulder: Secondary | ICD-10-CM | POA: Diagnosis not present

## 2022-04-28 DIAGNOSIS — M6281 Muscle weakness (generalized): Secondary | ICD-10-CM | POA: Diagnosis not present

## 2022-05-01 DIAGNOSIS — M6281 Muscle weakness (generalized): Secondary | ICD-10-CM | POA: Diagnosis not present

## 2022-05-01 DIAGNOSIS — M25511 Pain in right shoulder: Secondary | ICD-10-CM | POA: Diagnosis not present

## 2022-05-05 DIAGNOSIS — M25511 Pain in right shoulder: Secondary | ICD-10-CM | POA: Diagnosis not present

## 2022-05-05 DIAGNOSIS — M6281 Muscle weakness (generalized): Secondary | ICD-10-CM | POA: Diagnosis not present

## 2022-05-08 DIAGNOSIS — M25511 Pain in right shoulder: Secondary | ICD-10-CM | POA: Diagnosis not present

## 2022-05-08 DIAGNOSIS — M6281 Muscle weakness (generalized): Secondary | ICD-10-CM | POA: Diagnosis not present

## 2022-05-12 DIAGNOSIS — M25511 Pain in right shoulder: Secondary | ICD-10-CM | POA: Diagnosis not present

## 2022-05-12 DIAGNOSIS — M6281 Muscle weakness (generalized): Secondary | ICD-10-CM | POA: Diagnosis not present

## 2022-05-15 DIAGNOSIS — M6281 Muscle weakness (generalized): Secondary | ICD-10-CM | POA: Diagnosis not present

## 2022-05-15 DIAGNOSIS — M25511 Pain in right shoulder: Secondary | ICD-10-CM | POA: Diagnosis not present

## 2022-05-19 DIAGNOSIS — M6281 Muscle weakness (generalized): Secondary | ICD-10-CM | POA: Diagnosis not present

## 2022-05-19 DIAGNOSIS — M25511 Pain in right shoulder: Secondary | ICD-10-CM | POA: Diagnosis not present

## 2022-05-22 DIAGNOSIS — M25511 Pain in right shoulder: Secondary | ICD-10-CM | POA: Diagnosis not present

## 2022-05-22 DIAGNOSIS — M6281 Muscle weakness (generalized): Secondary | ICD-10-CM | POA: Diagnosis not present

## 2022-05-26 DIAGNOSIS — M6281 Muscle weakness (generalized): Secondary | ICD-10-CM | POA: Diagnosis not present

## 2022-05-26 DIAGNOSIS — M25511 Pain in right shoulder: Secondary | ICD-10-CM | POA: Diagnosis not present

## 2022-05-28 DIAGNOSIS — I739 Peripheral vascular disease, unspecified: Secondary | ICD-10-CM | POA: Diagnosis not present

## 2022-05-28 DIAGNOSIS — E1122 Type 2 diabetes mellitus with diabetic chronic kidney disease: Secondary | ICD-10-CM | POA: Diagnosis not present

## 2022-05-29 DIAGNOSIS — M6281 Muscle weakness (generalized): Secondary | ICD-10-CM | POA: Diagnosis not present

## 2022-05-29 DIAGNOSIS — M25511 Pain in right shoulder: Secondary | ICD-10-CM | POA: Diagnosis not present

## 2022-06-02 DIAGNOSIS — M6281 Muscle weakness (generalized): Secondary | ICD-10-CM | POA: Diagnosis not present

## 2022-06-02 DIAGNOSIS — M25511 Pain in right shoulder: Secondary | ICD-10-CM | POA: Diagnosis not present

## 2022-06-05 DIAGNOSIS — M25511 Pain in right shoulder: Secondary | ICD-10-CM | POA: Diagnosis not present

## 2022-06-05 DIAGNOSIS — M6281 Muscle weakness (generalized): Secondary | ICD-10-CM | POA: Diagnosis not present

## 2022-06-28 DIAGNOSIS — I739 Peripheral vascular disease, unspecified: Secondary | ICD-10-CM | POA: Diagnosis not present

## 2022-06-28 DIAGNOSIS — E1122 Type 2 diabetes mellitus with diabetic chronic kidney disease: Secondary | ICD-10-CM | POA: Diagnosis not present

## 2022-07-28 DIAGNOSIS — I739 Peripheral vascular disease, unspecified: Secondary | ICD-10-CM | POA: Diagnosis not present

## 2022-07-28 DIAGNOSIS — E1122 Type 2 diabetes mellitus with diabetic chronic kidney disease: Secondary | ICD-10-CM | POA: Diagnosis not present

## 2022-08-18 DIAGNOSIS — N1832 Chronic kidney disease, stage 3b: Secondary | ICD-10-CM | POA: Diagnosis not present

## 2022-08-18 DIAGNOSIS — E1169 Type 2 diabetes mellitus with other specified complication: Secondary | ICD-10-CM | POA: Diagnosis not present

## 2022-08-25 DIAGNOSIS — E1169 Type 2 diabetes mellitus with other specified complication: Secondary | ICD-10-CM | POA: Diagnosis not present

## 2022-08-25 DIAGNOSIS — Z6830 Body mass index (BMI) 30.0-30.9, adult: Secondary | ICD-10-CM | POA: Diagnosis not present

## 2022-08-25 DIAGNOSIS — I25119 Atherosclerotic heart disease of native coronary artery with unspecified angina pectoris: Secondary | ICD-10-CM | POA: Diagnosis not present

## 2022-08-25 DIAGNOSIS — N1832 Chronic kidney disease, stage 3b: Secondary | ICD-10-CM | POA: Diagnosis not present

## 2022-08-25 DIAGNOSIS — E782 Mixed hyperlipidemia: Secondary | ICD-10-CM | POA: Diagnosis not present

## 2022-08-27 DIAGNOSIS — Z96611 Presence of right artificial shoulder joint: Secondary | ICD-10-CM | POA: Diagnosis not present

## 2022-08-28 DIAGNOSIS — E1169 Type 2 diabetes mellitus with other specified complication: Secondary | ICD-10-CM | POA: Diagnosis not present

## 2022-08-28 DIAGNOSIS — I739 Peripheral vascular disease, unspecified: Secondary | ICD-10-CM | POA: Diagnosis not present

## 2022-09-18 DIAGNOSIS — N200 Calculus of kidney: Secondary | ICD-10-CM | POA: Diagnosis not present

## 2022-09-28 DIAGNOSIS — E1169 Type 2 diabetes mellitus with other specified complication: Secondary | ICD-10-CM | POA: Diagnosis not present

## 2022-09-28 DIAGNOSIS — I739 Peripheral vascular disease, unspecified: Secondary | ICD-10-CM | POA: Diagnosis not present

## 2022-10-28 DIAGNOSIS — I739 Peripheral vascular disease, unspecified: Secondary | ICD-10-CM | POA: Diagnosis not present

## 2022-10-28 DIAGNOSIS — E1169 Type 2 diabetes mellitus with other specified complication: Secondary | ICD-10-CM | POA: Diagnosis not present

## 2022-11-28 DIAGNOSIS — I739 Peripheral vascular disease, unspecified: Secondary | ICD-10-CM | POA: Diagnosis not present

## 2022-11-28 DIAGNOSIS — E1169 Type 2 diabetes mellitus with other specified complication: Secondary | ICD-10-CM | POA: Diagnosis not present

## 2022-12-15 DIAGNOSIS — E1169 Type 2 diabetes mellitus with other specified complication: Secondary | ICD-10-CM | POA: Diagnosis not present

## 2022-12-15 DIAGNOSIS — N1832 Chronic kidney disease, stage 3b: Secondary | ICD-10-CM | POA: Diagnosis not present

## 2022-12-15 DIAGNOSIS — L578 Other skin changes due to chronic exposure to nonionizing radiation: Secondary | ICD-10-CM | POA: Diagnosis not present

## 2022-12-22 DIAGNOSIS — Z139 Encounter for screening, unspecified: Secondary | ICD-10-CM | POA: Diagnosis not present

## 2022-12-22 DIAGNOSIS — I25119 Atherosclerotic heart disease of native coronary artery with unspecified angina pectoris: Secondary | ICD-10-CM | POA: Diagnosis not present

## 2022-12-22 DIAGNOSIS — Z23 Encounter for immunization: Secondary | ICD-10-CM | POA: Diagnosis not present

## 2022-12-22 DIAGNOSIS — Z0000 Encounter for general adult medical examination without abnormal findings: Secondary | ICD-10-CM | POA: Diagnosis not present

## 2022-12-22 DIAGNOSIS — Z1389 Encounter for screening for other disorder: Secondary | ICD-10-CM | POA: Diagnosis not present

## 2022-12-22 DIAGNOSIS — E782 Mixed hyperlipidemia: Secondary | ICD-10-CM | POA: Diagnosis not present

## 2022-12-22 DIAGNOSIS — Z1339 Encounter for screening examination for other mental health and behavioral disorders: Secondary | ICD-10-CM | POA: Diagnosis not present

## 2022-12-22 DIAGNOSIS — E1169 Type 2 diabetes mellitus with other specified complication: Secondary | ICD-10-CM | POA: Diagnosis not present

## 2022-12-22 DIAGNOSIS — E1151 Type 2 diabetes mellitus with diabetic peripheral angiopathy without gangrene: Secondary | ICD-10-CM | POA: Diagnosis not present

## 2022-12-22 DIAGNOSIS — N1832 Chronic kidney disease, stage 3b: Secondary | ICD-10-CM | POA: Diagnosis not present

## 2022-12-22 DIAGNOSIS — Z1331 Encounter for screening for depression: Secondary | ICD-10-CM | POA: Diagnosis not present

## 2022-12-28 DIAGNOSIS — E1169 Type 2 diabetes mellitus with other specified complication: Secondary | ICD-10-CM | POA: Diagnosis not present

## 2022-12-28 DIAGNOSIS — I739 Peripheral vascular disease, unspecified: Secondary | ICD-10-CM | POA: Diagnosis not present

## 2023-01-28 DIAGNOSIS — E1169 Type 2 diabetes mellitus with other specified complication: Secondary | ICD-10-CM | POA: Diagnosis not present

## 2023-01-28 DIAGNOSIS — I739 Peripheral vascular disease, unspecified: Secondary | ICD-10-CM | POA: Diagnosis not present

## 2023-02-28 DIAGNOSIS — I739 Peripheral vascular disease, unspecified: Secondary | ICD-10-CM | POA: Diagnosis not present

## 2023-02-28 DIAGNOSIS — E1169 Type 2 diabetes mellitus with other specified complication: Secondary | ICD-10-CM | POA: Diagnosis not present

## 2023-03-23 DIAGNOSIS — E1169 Type 2 diabetes mellitus with other specified complication: Secondary | ICD-10-CM | POA: Diagnosis not present

## 2023-03-26 DIAGNOSIS — N138 Other obstructive and reflux uropathy: Secondary | ICD-10-CM | POA: Diagnosis not present

## 2023-03-26 DIAGNOSIS — N200 Calculus of kidney: Secondary | ICD-10-CM | POA: Diagnosis not present

## 2023-03-28 DIAGNOSIS — I739 Peripheral vascular disease, unspecified: Secondary | ICD-10-CM | POA: Diagnosis not present

## 2023-03-28 DIAGNOSIS — E1169 Type 2 diabetes mellitus with other specified complication: Secondary | ICD-10-CM | POA: Diagnosis not present

## 2023-03-30 DIAGNOSIS — E782 Mixed hyperlipidemia: Secondary | ICD-10-CM | POA: Diagnosis not present

## 2023-03-30 DIAGNOSIS — E1169 Type 2 diabetes mellitus with other specified complication: Secondary | ICD-10-CM | POA: Diagnosis not present

## 2023-03-30 DIAGNOSIS — N1832 Chronic kidney disease, stage 3b: Secondary | ICD-10-CM | POA: Diagnosis not present

## 2023-04-27 DIAGNOSIS — L578 Other skin changes due to chronic exposure to nonionizing radiation: Secondary | ICD-10-CM | POA: Diagnosis not present

## 2023-04-27 DIAGNOSIS — L570 Actinic keratosis: Secondary | ICD-10-CM | POA: Diagnosis not present

## 2023-04-27 DIAGNOSIS — L739 Follicular disorder, unspecified: Secondary | ICD-10-CM | POA: Diagnosis not present

## 2023-04-28 DIAGNOSIS — N1832 Chronic kidney disease, stage 3b: Secondary | ICD-10-CM | POA: Diagnosis not present

## 2023-04-28 DIAGNOSIS — E1169 Type 2 diabetes mellitus with other specified complication: Secondary | ICD-10-CM | POA: Diagnosis not present

## 2023-05-28 DIAGNOSIS — E1169 Type 2 diabetes mellitus with other specified complication: Secondary | ICD-10-CM | POA: Diagnosis not present

## 2023-05-28 DIAGNOSIS — N1832 Chronic kidney disease, stage 3b: Secondary | ICD-10-CM | POA: Diagnosis not present

## 2023-06-28 DIAGNOSIS — N1832 Chronic kidney disease, stage 3b: Secondary | ICD-10-CM | POA: Diagnosis not present

## 2023-06-28 DIAGNOSIS — E1169 Type 2 diabetes mellitus with other specified complication: Secondary | ICD-10-CM | POA: Diagnosis not present

## 2023-07-03 DIAGNOSIS — E1169 Type 2 diabetes mellitus with other specified complication: Secondary | ICD-10-CM | POA: Diagnosis not present

## 2023-07-03 DIAGNOSIS — N1832 Chronic kidney disease, stage 3b: Secondary | ICD-10-CM | POA: Diagnosis not present

## 2023-07-13 DIAGNOSIS — E1122 Type 2 diabetes mellitus with diabetic chronic kidney disease: Secondary | ICD-10-CM | POA: Diagnosis not present

## 2023-07-13 DIAGNOSIS — N183 Chronic kidney disease, stage 3 unspecified: Secondary | ICD-10-CM | POA: Diagnosis not present

## 2023-07-13 DIAGNOSIS — E1169 Type 2 diabetes mellitus with other specified complication: Secondary | ICD-10-CM | POA: Diagnosis not present

## 2023-07-13 DIAGNOSIS — E782 Mixed hyperlipidemia: Secondary | ICD-10-CM | POA: Diagnosis not present

## 2023-07-24 DIAGNOSIS — M160 Bilateral primary osteoarthritis of hip: Secondary | ICD-10-CM | POA: Diagnosis not present

## 2023-07-24 DIAGNOSIS — M7061 Trochanteric bursitis, right hip: Secondary | ICD-10-CM | POA: Diagnosis not present

## 2023-07-24 DIAGNOSIS — M1611 Unilateral primary osteoarthritis, right hip: Secondary | ICD-10-CM | POA: Diagnosis not present

## 2023-07-28 DIAGNOSIS — N183 Chronic kidney disease, stage 3 unspecified: Secondary | ICD-10-CM | POA: Diagnosis not present

## 2023-07-28 DIAGNOSIS — E1122 Type 2 diabetes mellitus with diabetic chronic kidney disease: Secondary | ICD-10-CM | POA: Diagnosis not present

## 2023-08-24 DIAGNOSIS — R059 Cough, unspecified: Secondary | ICD-10-CM | POA: Diagnosis not present

## 2023-08-24 DIAGNOSIS — Z20822 Contact with and (suspected) exposure to covid-19: Secondary | ICD-10-CM | POA: Diagnosis not present

## 2023-08-28 DIAGNOSIS — N183 Chronic kidney disease, stage 3 unspecified: Secondary | ICD-10-CM | POA: Diagnosis not present

## 2023-08-28 DIAGNOSIS — E1122 Type 2 diabetes mellitus with diabetic chronic kidney disease: Secondary | ICD-10-CM | POA: Diagnosis not present

## 2023-09-23 DIAGNOSIS — Z96611 Presence of right artificial shoulder joint: Secondary | ICD-10-CM | POA: Diagnosis not present

## 2023-09-24 DIAGNOSIS — N200 Calculus of kidney: Secondary | ICD-10-CM | POA: Diagnosis not present

## 2023-09-28 DIAGNOSIS — N183 Chronic kidney disease, stage 3 unspecified: Secondary | ICD-10-CM | POA: Diagnosis not present

## 2023-09-28 DIAGNOSIS — E1122 Type 2 diabetes mellitus with diabetic chronic kidney disease: Secondary | ICD-10-CM | POA: Diagnosis not present

## 2023-10-28 DIAGNOSIS — E1169 Type 2 diabetes mellitus with other specified complication: Secondary | ICD-10-CM | POA: Diagnosis not present

## 2023-10-28 DIAGNOSIS — N183 Chronic kidney disease, stage 3 unspecified: Secondary | ICD-10-CM | POA: Diagnosis not present

## 2023-10-28 DIAGNOSIS — M7061 Trochanteric bursitis, right hip: Secondary | ICD-10-CM | POA: Diagnosis not present

## 2023-11-16 DIAGNOSIS — E1169 Type 2 diabetes mellitus with other specified complication: Secondary | ICD-10-CM | POA: Diagnosis not present

## 2023-11-16 DIAGNOSIS — N1832 Chronic kidney disease, stage 3b: Secondary | ICD-10-CM | POA: Diagnosis not present

## 2023-11-23 DIAGNOSIS — N1832 Chronic kidney disease, stage 3b: Secondary | ICD-10-CM | POA: Diagnosis not present
